# Patient Record
Sex: Male | Born: 1945 | Race: White | Hispanic: No | Smoking: Former smoker
Health system: Southern US, Community
[De-identification: ages and names within clinical notes are randomized; demographics above are authoritative.]

## PROBLEM LIST (undated history)

## (undated) DIAGNOSIS — J8410 Pulmonary fibrosis, unspecified: Secondary | ICD-10-CM

## (undated) DIAGNOSIS — I340 Nonrheumatic mitral (valve) insufficiency: Secondary | ICD-10-CM

## (undated) DIAGNOSIS — M109 Gout, unspecified: Secondary | ICD-10-CM

## (undated) DIAGNOSIS — E785 Hyperlipidemia, unspecified: Secondary | ICD-10-CM

## (undated) DIAGNOSIS — I509 Heart failure, unspecified: Secondary | ICD-10-CM

## (undated) DIAGNOSIS — I10 Essential (primary) hypertension: Secondary | ICD-10-CM

## (undated) DIAGNOSIS — J986 Disorders of diaphragm: Secondary | ICD-10-CM

## (undated) DIAGNOSIS — I2510 Atherosclerotic heart disease of native coronary artery without angina pectoris: Secondary | ICD-10-CM

## (undated) DIAGNOSIS — C469 Kaposi's sarcoma, unspecified: Secondary | ICD-10-CM

## (undated) DIAGNOSIS — J449 Chronic obstructive pulmonary disease, unspecified: Secondary | ICD-10-CM

## (undated) DIAGNOSIS — G4733 Obstructive sleep apnea (adult) (pediatric): Secondary | ICD-10-CM

## (undated) HISTORY — PX: CORONARY ANGIOPLASTY WITH STENT PLACEMENT: SHX49

## (undated) HISTORY — PX: CATARACT EXTRACTION: SUR2

---

## 2015-09-30 ENCOUNTER — Emergency Department (HOSPITAL_COMMUNITY): Payer: Medicare Other

## 2015-09-30 ENCOUNTER — Emergency Department (HOSPITAL_COMMUNITY)
Admission: EM | Admit: 2015-09-30 | Discharge: 2015-09-30 | Disposition: A | Discharge status: Home / Self Care | Payer: Medicare Other | Attending: Emergency Medicine | Admitting: Emergency Medicine

## 2015-09-30 ENCOUNTER — Encounter (HOSPITAL_COMMUNITY): Payer: Self-pay | Admitting: Emergency Medicine

## 2015-09-30 DIAGNOSIS — Z79899 Other long term (current) drug therapy: Secondary | ICD-10-CM | POA: Diagnosis not present

## 2015-09-30 DIAGNOSIS — J189 Pneumonia, unspecified organism: Secondary | ICD-10-CM

## 2015-09-30 DIAGNOSIS — Z9861 Coronary angioplasty status: Secondary | ICD-10-CM | POA: Insufficient documentation

## 2015-09-30 DIAGNOSIS — J159 Unspecified bacterial pneumonia: Secondary | ICD-10-CM | POA: Diagnosis not present

## 2015-09-30 DIAGNOSIS — Z7951 Long term (current) use of inhaled steroids: Secondary | ICD-10-CM | POA: Insufficient documentation

## 2015-09-30 DIAGNOSIS — E785 Hyperlipidemia, unspecified: Secondary | ICD-10-CM | POA: Diagnosis not present

## 2015-09-30 DIAGNOSIS — Z7982 Long term (current) use of aspirin: Secondary | ICD-10-CM | POA: Insufficient documentation

## 2015-09-30 DIAGNOSIS — R112 Nausea with vomiting, unspecified: Secondary | ICD-10-CM | POA: Diagnosis not present

## 2015-09-30 DIAGNOSIS — J8410 Pulmonary fibrosis, unspecified: Secondary | ICD-10-CM

## 2015-09-30 DIAGNOSIS — I10 Essential (primary) hypertension: Secondary | ICD-10-CM | POA: Diagnosis not present

## 2015-09-30 DIAGNOSIS — R05 Cough: Secondary | ICD-10-CM | POA: Diagnosis present

## 2015-09-30 HISTORY — DX: Hyperlipidemia, unspecified: E78.5

## 2015-09-30 HISTORY — DX: Pulmonary fibrosis, unspecified: J84.10

## 2015-09-30 HISTORY — DX: Nonrheumatic mitral (valve) insufficiency: I34.0

## 2015-09-30 HISTORY — DX: Essential (primary) hypertension: I10

## 2015-09-30 LAB — COMPREHENSIVE METABOLIC PANEL
ALT: 10 U/L — AB (ref 17–63)
AST: 14 U/L — ABNORMAL LOW (ref 15–41)
Albumin: 3.2 g/dL — ABNORMAL LOW (ref 3.5–5.0)
Alkaline Phosphatase: 88 U/L (ref 38–126)
Anion gap: 4 — ABNORMAL LOW (ref 5–15)
BUN: 12 mg/dL (ref 6–20)
CHLORIDE: 95 mmol/L — AB (ref 101–111)
CO2: 40 mmol/L — AB (ref 22–32)
CREATININE: 0.88 mg/dL (ref 0.61–1.24)
Calcium: 10 mg/dL (ref 8.9–10.3)
GFR calc non Af Amer: >60 mL/min (ref >60)
Glucose, Bld: 106 mg/dL — ABNORMAL HIGH (ref 65–99)
POTASSIUM: 4.3 mmol/L (ref 3.5–5.1)
SODIUM: 139 mmol/L (ref 135–145)
Total Bilirubin: 0.7 mg/dL (ref 0.3–1.2)
Total Protein: 6.4 g/dL — ABNORMAL LOW (ref 6.5–8.1)

## 2015-09-30 LAB — CBC WITH DIFFERENTIAL/PLATELET
BASOS ABS: 0 10*3/uL (ref 0.0–0.1)
Basophils Relative: 0 %
EOS ABS: 0 10*3/uL (ref 0.0–0.7)
EOS PCT: 1 %
HCT: 31.9 % — ABNORMAL LOW (ref 39.0–52.0)
Hemoglobin: 9.9 g/dL — ABNORMAL LOW (ref 13.0–17.0)
LYMPHS ABS: 0.3 10*3/uL — AB (ref 0.7–4.0)
Lymphocytes Relative: 5 %
MCH: 29.3 pg (ref 26.0–34.0)
MCHC: 31 g/dL (ref 30.0–36.0)
MCV: 94.4 fL (ref 78.0–100.0)
Monocytes Absolute: 0.4 10*3/uL (ref 0.1–1.0)
Monocytes Relative: 9 %
Neutro Abs: 4.3 10*3/uL (ref 1.7–7.7)
Neutrophils Relative %: 85 %
PLATELETS: 154 10*3/uL (ref 150–400)
RBC: 3.38 MIL/uL — AB (ref 4.22–5.81)
RDW: 13.7 % (ref 11.5–15.5)
WBC: 5.1 10*3/uL (ref 4.0–10.5)

## 2015-09-30 MED ORDER — IPRATROPIUM-ALBUTEROL 0.5-2.5 (3) MG/3ML IN SOLN: 3.0000 mL | Freq: Four times a day (QID) | RESPIRATORY_TRACT | Status: DC

## 2015-09-30 MED ORDER — LEVOFLOXACIN 500 MG PO TABS: 500.0000 mg | ORAL_TABLET | Freq: Every day | ORAL | Status: AC

## 2015-09-30 MED ORDER — IPRATROPIUM-ALBUTEROL 0.5-2.5 (3) MG/3ML IN SOLN
3.0000 mL | Freq: Once | RESPIRATORY_TRACT | Status: AC
  Administered 2015-09-30: 3 mL via RESPIRATORY_TRACT
  Filled 2015-09-30: qty 3

## 2015-09-30 MED ORDER — IPRATROPIUM-ALBUTEROL 0.5-2.5 (3) MG/3ML IN SOLN
3.0000 mL | RESPIRATORY_TRACT | Status: DC
Start: 2015-09-30 — End: 2015-09-30
  Filled 2015-09-30: qty 3

## 2015-09-30 MED ORDER — LEVOFLOXACIN 500 MG PO TABS
500.0000 mg | ORAL_TABLET | Freq: Once | ORAL | Status: AC
  Administered 2015-09-30: 500 mg via ORAL
  Filled 2015-09-30: qty 1

## 2015-09-30 MED ORDER — ALBUTEROL SULFATE (2.5 MG/3ML) 0.083% IN NEBU
5.0000 mg | INHALATION_SOLUTION | Freq: Once | RESPIRATORY_TRACT | Status: AC
  Administered 2015-09-30: 5 mg via RESPIRATORY_TRACT
  Filled 2015-09-30: qty 6

## 2015-09-30 MED ORDER — METOCLOPRAMIDE HCL 10 MG PO TABS: 10.0000 mg | ORAL_TABLET | Freq: Three times a day (TID) | ORAL | Status: DC | PRN

## 2015-09-30 MED ORDER — METOCLOPRAMIDE HCL 10 MG PO TABS
10.0000 mg | ORAL_TABLET | Freq: Once | ORAL | Status: AC
  Administered 2015-09-30: 10 mg via ORAL
  Filled 2015-09-30: qty 1

## 2015-09-30 NOTE — Progress Notes (Signed)
   09/30/15 0000  CM Assessment  Expected Discharge [place]  In-house Referral NA  Discharge Planning Services CM Consult  [facility] Choice Home Health  DME Arranged Oxygen  DME Agency [place] Arranged RN;[facility]  Status of Service Completed, signed off  Discharge [place]

## 2015-09-30 NOTE — Care Management Note (Addendum)
Case Management Note  Patient Details  Name: Joshua Conrad MRN: [id number] Date of Birth: [birth year]  Subjective/Objective:               Pt states he lives with Joshua Conrad (works at [employer] in dialysis) and her husband  In [place] Pt on 4 L O2 at home provided by [employer] He moved to [place] 11/2 -2 months ago Has seen Dr Nancy Fetter at [place] 1 1/2 -2 weeks ago He thought he was being assisted with home health via Dr Nancy Fetter off [address] at [place] but no response Pt choice is [facility] or [facility] sevices already set up by Joshua Conrad.     Action/Plan:  Assessed for needs CM reviewed in details medicare guidelines, home health [facility]) (length of stay in home, types of [facility] staff available, coverage, primary caregiver, up to 24 hrs before services may be started) and Private duty nursing (PDN-coverage, length of stay in the home types of staff available). CM reviewed availability of [place] [facility] to assist pcp to get pt to snf (if desired disposition) from the community level. CM provided pt/family with a list of [place] home health agencies and PDN. Discussed Long term care coverage for PDN services  Discussed senior  services of [place]  CM spokew ith Tiffany at [phone] [facility] to call in [place]  1537 spoke with Dr Dayna Barker about faceto face  [phone] a referral for meals on wheels provided to [name]  Expected Discharge Date:   09/30/15                Expected Discharge Plan:  [place]  In-House Referral:  NA  Discharge planning Services  CM Consult  Post Acute Care Choice:  Home Health Choice offered to:     DME Arranged:  Oxygen DME Agency:  [employer]  HH Arranged:  RN, Nurse[facility] [place] Agency:  [place]  Status of Service:  Completed, signed off    Additional Comments:  Robbie Lis, RN 09/30/2015, 3:28 PM

## 2015-09-30 NOTE — ED Notes (Signed)
X-ray at bedside

## 2015-09-30 NOTE — ED Notes (Signed)
Per pt, states nausea, dry heaves and pulmonary congestion for about a week

## 2015-09-30 NOTE — ED Notes (Signed)
Unsuccessful attempt for blood work

## 2015-09-30 NOTE — ED Provider Notes (Signed)
CSN: [id number]     Arrival date & time 09/30/15  1152 History   First MD Initiated Contact with Patient 09/30/15 1301     Chief Complaint  Patient presents with  . pulmonary congestion   . Nausea     (Consider location/radiation/quality/duration/timing/severity/associated sxs/prior Treatment) Patient is a 69 y.o. male presenting with cough.  Cough Cough characteristics:  Productive Sputum characteristics:  Yellow and green Severity:  Moderate Onset quality:  Gradual Duration:  5 days Progression:  Worsening Chronicity:  Recurrent Relieved by:  None tried Worsened by:  Nothing tried Ineffective treatments:  None tried Associated symptoms: chest pain, chills, fever and shortness of breath   Associated symptoms: no rhinorrhea     Past Medical History  Diagnosis Date  . Pulmonary fibrosis ([place])   . MI (mitral incompetence)   . Hypertension   . Hyperlipidemia    Past Surgical History  Procedure Laterality Date  . Coronary angioplasty with stent placement    . Cataract extraction     No family history on file. Social History  Substance Use Topics  . Smoking status: Never Smoker   . Smokeless tobacco: None  . Alcohol Use: No    Review of Systems  Constitutional: Positive for fever, chills and unexpected weight change.  HENT: Negative for rhinorrhea.   Eyes: Negative for visual disturbance.  Respiratory: Positive for cough and shortness of breath.   Cardiovascular: Positive for chest pain.  Gastrointestinal: Positive for nausea and vomiting. Negative for abdominal pain.  Endocrine: Negative for polydipsia and polyuria.  Genitourinary: Negative for frequency and flank pain.  Musculoskeletal: Negative for back pain and neck pain.  Skin: Negative for pallor.      Allergies  Prednisone  Home Medications   Prior to Admission medications   Medication Sig Start Date End Date Taking? Authorizing Provider  albuterol (PROVENTIL HFA;VENTOLIN HFA) 108 (90 BASE)  MCG/ACT inhaler Inhale 2 puffs into the lungs every 6 (six) hours as needed for wheezing or shortness of breath.   Yes Historical Provider, MD  albuterol (PROVENTIL) (2.5 MG/3ML) 0.083% nebulizer solution Take 2.5 mg by nebulization every 4 (four) hours as needed for wheezing or shortness of breath.   Yes Historical Provider, MD  allopurinol (ZYLOPRIM) 300 MG tablet Take 300 mg by mouth daily.   Yes Historical Provider, MD  Alum Hydroxide-Mag Carbonate (GAVISCON PO) Take 15-30 mLs by mouth 2 (two) times daily as needed (upset stomach).   Yes Historical Provider, MD  aspirin 81 MG tablet Take 81 mg by mouth daily.   Yes Historical Provider, MD  bumetanide (BUMEX) 1 MG tablet Take 1 mg by mouth daily.   Yes Historical Provider, MD  cholecalciferol (VITAMIN D) 400 UNITS TABS tablet Take 400 Units by mouth daily.   Yes Historical Provider, MD  ferrous sulfate 325 (65 FE) MG tablet Take 325 mg by mouth 2 (two) times daily.   Yes Historical Provider, MD  fluticasone (FLONASE) 50 MCG/ACT nasal spray Place 2 sprays into both nostrils at bedtime.   Yes Historical Provider, MD  losartan (COZAAR) 50 MG tablet Take 50 mg by mouth daily.   Yes Historical Provider, MD  montelukast (SINGULAIR) 10 MG tablet Take 10 mg by mouth at bedtime.   Yes Historical Provider, MD  mycophenolate (CELLCEPT) 500 MG tablet Take 1,500 mg by mouth 2 (two) times daily.   Yes Historical Provider, MD  OXYGEN Inhale 4 L into the lungs continuous.   Yes Historical Provider, MD  potassium chloride (K-DUR)  10 MEQ tablet Take 10 mEq by mouth daily.   Yes Historical Provider, MD  ranitidine (ZANTAC) 150 MG tablet Take 150 mg by mouth 2 (two) times daily.   Yes Historical Provider, MD  simvastatin (ZOCOR) 5 MG tablet Take 5 mg by mouth at bedtime.   Yes Historical Provider, MD  vitamin C (ASCORBIC ACID) 500 MG tablet Take 500 mg by mouth daily.   Yes Historical Provider, MD  levofloxacin (LEVAQUIN) 500 MG tablet Take 1 tablet (500 mg total)  by mouth daily. 10/01/15 10/10/15  Merrily Pew, MD  metoCLOPramide (REGLAN) 10 MG tablet Take 1 tablet (10 mg total) by mouth every 8 (eight) hours as needed for nausea. 09/30/15   Corene Cornea Brinda Focht, MD   BP 139/84 mmHg  Pulse 100  Temp(Src) 98.1 F (36.7 C) (Oral)  Resp 20  SpO2 98% Physical Exam  Constitutional: He is oriented to person, place, and time. He appears well-developed and well-nourished.  HENT:  Head: Normocephalic and atraumatic.  Eyes: Conjunctivae and EOM are normal.  Neck: Normal range of motion. Neck supple.  Cardiovascular: Normal rate and regular rhythm.   Pulmonary/Chest: Effort normal. Tachypnea noted. No respiratory distress. He has no decreased breath sounds. He has wheezes. He has rhonchi. He has rales.  Abdominal: Soft. There is no tenderness.  Musculoskeletal: Normal range of motion. He exhibits no edema or tenderness.  Neurological: He is alert and oriented to person, place, and time.  Skin: Skin is warm and dry.  Nursing note and vitals reviewed.   ED Course  Procedures (including critical care time) Labs Review Labs Reviewed  CBC WITH DIFFERENTIAL/PLATELET - Abnormal; Notable for the following:    RBC 3.38 (*)    Hemoglobin 9.9 (*)    HCT 31.9 (*)    Lymphs Abs 0.3 (*)    All other components within normal limits  COMPREHENSIVE METABOLIC PANEL - Abnormal; Notable for the following:    Chloride 95 (*)    CO2 40 (*)    Glucose, Bld 106 (*)    Total Protein 6.4 (*)    Albumin 3.2 (*)    AST 14 (*)    ALT 10 (*)    Anion gap 4 (*)    All other components within normal limits    Imaging Review Dg Chest Portable 1 View  09/30/2015  CLINICAL DATA:  Shortness of breath. EXAM: PORTABLE CHEST 1 VIEW COMPARISON:  None. FINDINGS: Hypoinflation of the lungs is noted. Elevated left hemidiaphragm is noted. No pneumothorax is noted. Diffuse interstitial opacities are noted throughout both lungs concerning for edema or possibly pneumonia. Bony thorax is  unremarkable. IMPRESSION: Hypoinflation of the lungs is noted. Elevated left hemidiaphragm is noted. Bibasilar interstitial densities are noted concerning for edema or interstitial pneumonia. No pneumothorax or significant pleural effusion is noted. Electronically Signed   By: Marijo Conception, M.D.   On: 09/30/2015 13:50   I have personally reviewed and evaluated these images and lab results as part of my medical decision-making.   EKG Interpretation None      MDM   Final diagnoses:  Community acquired pneumonia  Pulmonary fibrosis ([place])    New here from [place] area, has pulmonary fibrosis, 4-5 days of worsening sputum production, cough, fatigue, post tussive emesis, weight loss, fever and chills. No new O2 requirement or distress. Can't take steroids. Duo nebs given in ED with some improvement. Care management to help as patient had needs in home with sister in law. abx given and  rx for same. Pt will continue following up with pulmonologist and pcp. No indication for admission at this time.   I have personally and contemperaneously reviewed labs and imaging and used in my decision making as above.   A medical screening exam was performed and I feel the patient has had an appropriate workup for their chief complaint at this time and likelihood of emergent condition existing is low. They have been counseled on decision, discharge, follow up and which symptoms necessitate immediate return to the emergency department. They or their family verbally stated understanding and agreement with plan and discharged in stable condition.      Merrily Pew, MD 10/01/15 [phone]

## 2015-11-01 ENCOUNTER — Emergency Department (HOSPITAL_COMMUNITY): Payer: Medicare Other

## 2015-11-01 ENCOUNTER — Observation Stay (HOSPITAL_COMMUNITY)
Admission: EM | Admit: 2015-11-01 | Discharge: 2015-11-03 | Disposition: A | Discharge status: Home / Self Care | Payer: Medicare Other | Attending: Family Medicine | Admitting: Family Medicine

## 2015-11-01 ENCOUNTER — Encounter (HOSPITAL_COMMUNITY): Payer: Self-pay | Admitting: *Deleted

## 2015-11-01 DIAGNOSIS — J986 Disorders of diaphragm: Secondary | ICD-10-CM | POA: Insufficient documentation

## 2015-11-01 DIAGNOSIS — C469 Kaposi's sarcoma, unspecified: Secondary | ICD-10-CM | POA: Diagnosis not present

## 2015-11-01 DIAGNOSIS — R410 Disorientation, unspecified: Secondary | ICD-10-CM | POA: Diagnosis not present

## 2015-11-01 DIAGNOSIS — D649 Anemia, unspecified: Secondary | ICD-10-CM | POA: Diagnosis present

## 2015-11-01 DIAGNOSIS — I252 Old myocardial infarction: Secondary | ICD-10-CM | POA: Insufficient documentation

## 2015-11-01 DIAGNOSIS — Z66 Do not resuscitate: Secondary | ICD-10-CM | POA: Diagnosis not present

## 2015-11-01 DIAGNOSIS — Z888 Allergy status to other drugs, medicaments and biological substances status: Secondary | ICD-10-CM | POA: Insufficient documentation

## 2015-11-01 DIAGNOSIS — J320 Chronic maxillary sinusitis: Secondary | ICD-10-CM | POA: Insufficient documentation

## 2015-11-01 DIAGNOSIS — J449 Chronic obstructive pulmonary disease, unspecified: Secondary | ICD-10-CM | POA: Diagnosis not present

## 2015-11-01 DIAGNOSIS — J8410 Pulmonary fibrosis, unspecified: Secondary | ICD-10-CM | POA: Insufficient documentation

## 2015-11-01 DIAGNOSIS — R197 Diarrhea, unspecified: Secondary | ICD-10-CM | POA: Diagnosis not present

## 2015-11-01 DIAGNOSIS — I2510 Atherosclerotic heart disease of native coronary artery without angina pectoris: Secondary | ICD-10-CM | POA: Diagnosis present

## 2015-11-01 DIAGNOSIS — E785 Hyperlipidemia, unspecified: Secondary | ICD-10-CM | POA: Insufficient documentation

## 2015-11-01 DIAGNOSIS — G4733 Obstructive sleep apnea (adult) (pediatric): Secondary | ICD-10-CM | POA: Diagnosis not present

## 2015-11-01 DIAGNOSIS — I110 Hypertensive heart disease with heart failure: Secondary | ICD-10-CM | POA: Diagnosis not present

## 2015-11-01 DIAGNOSIS — Z79899 Other long term (current) drug therapy: Secondary | ICD-10-CM | POA: Insufficient documentation

## 2015-11-01 DIAGNOSIS — I509 Heart failure, unspecified: Secondary | ICD-10-CM | POA: Insufficient documentation

## 2015-11-01 DIAGNOSIS — Z923 Personal history of irradiation: Secondary | ICD-10-CM | POA: Diagnosis not present

## 2015-11-01 DIAGNOSIS — I5032 Chronic diastolic (congestive) heart failure: Secondary | ICD-10-CM

## 2015-11-01 DIAGNOSIS — D61818 Other pancytopenia: Secondary | ICD-10-CM | POA: Insufficient documentation

## 2015-11-01 DIAGNOSIS — Z87891 Personal history of nicotine dependence: Secondary | ICD-10-CM | POA: Diagnosis not present

## 2015-11-01 DIAGNOSIS — Z7982 Long term (current) use of aspirin: Secondary | ICD-10-CM | POA: Insufficient documentation

## 2015-11-01 HISTORY — DX: Heart failure, unspecified: I50.9

## 2015-11-01 HISTORY — DX: Atherosclerotic heart disease of native coronary artery without angina pectoris: I25.10

## 2015-11-01 HISTORY — DX: Chronic obstructive pulmonary disease, unspecified: J44.9

## 2015-11-01 LAB — URINALYSIS, ROUTINE W REFLEX MICROSCOPIC
BILIRUBIN URINE: NEGATIVE
GLUCOSE, UA: NEGATIVE mg/dL
Hgb urine dipstick: NEGATIVE
Ketones, ur: NEGATIVE mg/dL
LEUKOCYTES UA: NEGATIVE
NITRITE: NEGATIVE
PH: 6.5 (ref 5.0–8.0)
Protein, ur: NEGATIVE mg/dL
SPECIFIC GRAVITY, URINE: 1.014 (ref 1.005–1.030)

## 2015-11-01 LAB — RAPID URINE DRUG SCREEN, HOSP PERFORMED
Amphetamines: NOT DETECTED
BENZODIAZEPINES: NOT DETECTED
Barbiturates: NOT DETECTED
COCAINE: NOT DETECTED
Opiates: NOT DETECTED
Tetrahydrocannabinol: NOT DETECTED

## 2015-11-01 LAB — CBC WITH DIFFERENTIAL/PLATELET
BASOS PCT: 0 %
Basophils Absolute: 0 10*3/uL (ref 0.0–0.1)
EOS ABS: 0.2 10*3/uL (ref 0.0–0.7)
EOS PCT: 6 %
HCT: 33.9 % — ABNORMAL LOW (ref 39.0–52.0)
HEMOGLOBIN: 10 g/dL — AB (ref 13.0–17.0)
LYMPHS ABS: 0.4 10*3/uL — AB (ref 0.7–4.0)
Lymphocytes Relative: 14 %
MCH: 29.2 pg (ref 26.0–34.0)
MCHC: 29.5 g/dL — AB (ref 30.0–36.0)
MCV: 98.8 fL (ref 78.0–100.0)
Monocytes Absolute: 0.4 10*3/uL (ref 0.1–1.0)
Monocytes Relative: 12 %
NEUTROS PCT: 68 %
Neutro Abs: 2 10*3/uL (ref 1.7–7.7)
PLATELETS: 121 10*3/uL — AB (ref 150–400)
RBC: 3.43 MIL/uL — AB (ref 4.22–5.81)
RDW: 13.6 % (ref 11.5–15.5)
WBC: 3 10*3/uL — AB (ref 4.0–10.5)

## 2015-11-01 LAB — COMPREHENSIVE METABOLIC PANEL
ALBUMIN: 3.1 g/dL — AB (ref 3.5–5.0)
ALT: 13 U/L — AB (ref 17–63)
ANION GAP: 5 (ref 5–15)
AST: 19 U/L (ref 15–41)
Alkaline Phosphatase: 82 U/L (ref 38–126)
BUN: 10 mg/dL (ref 6–20)
CHLORIDE: 92 mmol/L — AB (ref 101–111)
CO2: 44 mmol/L — AB (ref 22–32)
Calcium: 9.5 mg/dL (ref 8.9–10.3)
Creatinine, Ser: 0.94 mg/dL (ref 0.61–1.24)
GFR calc non Af Amer: >60 mL/min (ref >60)
GLUCOSE: 90 mg/dL (ref 65–99)
Potassium: 3.9 mmol/L (ref 3.5–5.1)
SODIUM: 141 mmol/L (ref 135–145)
Total Bilirubin: 0.6 mg/dL (ref 0.3–1.2)
Total Protein: 5.6 g/dL — ABNORMAL LOW (ref 6.5–8.1)

## 2015-11-01 NOTE — ED Provider Notes (Addendum)
Level5 caveat altered mental status history is obtained from patient's sister-in-law who accompanies him. Patient has been seeing people that aren't there. And hearing voices since today. He had a blanket lying on top of him which she felt was a dog. Hallucinations are a new phenomenon for him since today. Patient admits to seeing things that aren't there. He is presently alert Glasgow Coma Score 15 answering appropriately    ED ECG REPORT   Date: 11/01/2015  Rate: 80  Rhythm: normal sinus rhythm  QRS Axis: normal  Intervals: normal  ST/T Wave abnormalities: nonspecific T wave changes  Conduction Disutrbances:none  Narrative Interpretation:   Old EKG Reviewed: none available  I have personally reviewed the EKG tracing and agree with the computerized printout as noted. Orlie Dakin, MD 11/01/15 [place], MD 11/02/15 [phone]

## 2015-11-01 NOTE — ED Provider Notes (Signed)
CSN: [id number]     Arrival date & time 11/01/15  1628 History   First MD Initiated Contact with Patient 11/01/15 1712     Chief Complaint  Patient presents with  . Hallucinations     (Consider location/radiation/quality/duration/timing/severity/associated sxs/prior Treatment) HPI  Patient is a 69 year old male with past medical history significant for pulmonary fibrosis, CAD, HTN, HLD, Kaposi's sarcoma undergoing radiation, who presents to the emergency department with visual hallucinations. Patient is accompanied by his sister-in-law who is currently living with the patient.  Sister-in-law is providing the history. Reports a 1 day history of visual hallucinations. He reports that he has been talking to people who are not there, reported that there was a cute dog on his lap that was not present. Also reports auditory hallucinations. Yesterday he slept throughout the day, did not sleep well overnight. Denies fevers, chills, chest pain, worsening shortness of breath, cough, congestion, abdominal pain, dysuria, diarrhea. No prior history of auditory hallucinations. Denies recent falls or head trauma. Patient currently denies any acute problems, uncertain why he is in the emergency department.  Past Medical History  Diagnosis Date  . Pulmonary fibrosis ([place])   . MI (mitral incompetence)   . Hypertension   . Hyperlipidemia   . Coronary artery disease   . CHF (congestive heart failure) ([place])   . COPD (chronic obstructive pulmonary disease) [facility])    Past Surgical History  Procedure Laterality Date  . Coronary angioplasty with stent placement    . Cataract extraction     Family History  Problem Relation Age of Onset  . Hypertension Mother   . Hypertension Father    Social History  Substance Use Topics  . Smoking status: Former Research scientist (life sciences)  . Smokeless tobacco: None  . Alcohol Use: No     Comment: Quit 8 years ago.    Review of Systems  Constitutional: Negative for fever and appetite  change.  HENT: Negative for congestion and trouble swallowing.   Eyes: Negative for visual disturbance.  Respiratory: Negative for chest tightness and shortness of breath.   Cardiovascular: Negative for chest pain and palpitations.  Gastrointestinal: Negative for nausea, vomiting, abdominal pain and blood in stool.  Genitourinary: Negative for dysuria, hematuria, flank pain and decreased urine volume.  Musculoskeletal: Negative for back pain and neck pain.  Skin: Negative for rash.  Neurological: Negative for dizziness, seizures, syncope, facial asymmetry, weakness, light-headedness, numbness and headaches.  Psychiatric/Behavioral: Positive for hallucinations (auditory and visual) and confusion.      Allergies  Prednisone  Home Medications   Prior to Admission medications   Medication Sig Start Date End Date Taking? Authorizing Provider  albuterol (PROVENTIL HFA;VENTOLIN HFA) 108 (90 BASE) MCG/ACT inhaler Inhale 2 puffs into the lungs every 6 (six) hours as needed for wheezing or shortness of breath.   Yes Historical Provider, MD  albuterol (PROVENTIL) (2.5 MG/3ML) 0.083% nebulizer solution Take 2.5 mg by nebulization every 4 (four) hours as needed for wheezing or shortness of breath.   Yes Historical Provider, MD  allopurinol (ZYLOPRIM) 300 MG tablet Take 300 mg by mouth daily.   Yes Historical Provider, MD  aspirin 81 MG tablet Take 81 mg by mouth daily.   Yes Historical Provider, MD  bumetanide (BUMEX) 1 MG tablet Take 1 mg by mouth daily.   Yes Historical Provider, MD  cholecalciferol (VITAMIN D) 400 UNITS TABS tablet Take 400 Units by mouth daily.   Yes Historical Provider, MD  ferrous sulfate 325 (65 FE) MG tablet Take 325  mg by mouth 2 (two) times daily.   Yes Historical Provider, MD  fluticasone (FLONASE) 50 MCG/ACT nasal spray Place 2 sprays into both nostrils at bedtime.   Yes Historical Provider, MD  losartan (COZAAR) 50 MG tablet Take 50 mg by mouth daily.   Yes Historical  Provider, MD  metoCLOPramide (REGLAN) 10 MG tablet Take 1 tablet (10 mg total) by mouth every 8 (eight) hours as needed for nausea. 09/30/15  Yes Merrily Pew, MD  montelukast (SINGULAIR) 10 MG tablet Take 10 mg by mouth at bedtime.   Yes Historical Provider, MD  mycophenolate (CELLCEPT) 500 MG tablet Take 1,500 mg by mouth 2 (two) times daily.   Yes Historical Provider, MD  OXYGEN Inhale 4 L into the lungs continuous.   Yes Historical Provider, MD  potassium chloride (K-DUR) 10 MEQ tablet Take 10 mEq by mouth daily.   Yes Historical Provider, MD  ranitidine (ZANTAC) 150 MG tablet Take 150 mg by mouth 2 (two) times daily.   Yes Historical Provider, MD  simvastatin (ZOCOR) 5 MG tablet Take 5 mg by mouth at bedtime.   Yes Historical Provider, MD  vitamin C (ASCORBIC ACID) 500 MG tablet Take 500 mg by mouth daily.   Yes Historical Provider, MD   BP 134/72 mmHg  Pulse 77  Temp(Src) 98.2 F (36.8 C) (Oral)  Resp 18  SpO2 100% Physical Exam  Constitutional: He is oriented to person, place, and time. He appears well-developed and well-nourished. No distress.  HENT:  Head: Normocephalic and atraumatic.  Right Ear: External ear normal.  Left Ear: External ear normal.  Mouth/Throat: Oropharynx is clear and moist.  Eyes: Conjunctivae and EOM are normal. Pupils are equal, round, and reactive to light. No scleral icterus.  Neck: Normal range of motion. Neck supple. No JVD present. No tracheal deviation present.  Cardiovascular: Normal rate, regular rhythm and intact distal pulses.   Pulmonary/Chest: Effort normal. No respiratory distress. He has no wheezes.  5L [place] (baseline home O2), diffuse coarse breath sounds throughout  Abdominal: Soft. He exhibits no distension. There is no tenderness. There is no rebound and no guarding.  Musculoskeletal: Normal range of motion.  Neurological: He is alert and oriented to person, place, and time. He has normal strength and normal reflexes. He displays no  tremor. No cranial nerve deficit or sensory deficit. He displays no seizure activity. Coordination normal. GCS eye subscore is 4. GCS verbal subscore is 4. GCS motor subscore is 6. He displays no Babinski's sign on the right side. He displays no Babinski's sign on the left side.  Skin: Skin is warm.  Psychiatric: He has a normal mood and affect.  Nursing note and vitals reviewed.   ED Course  Procedures (including critical care time) Labs Review Labs Reviewed  CBC WITH DIFFERENTIAL/PLATELET - Abnormal; Notable for the following:    WBC 3.0 (*)    RBC 3.43 (*)    Hemoglobin 10.0 (*)    HCT 33.9 (*)    MCHC 29.5 (*)    Platelets 121 (*)    Lymphs Abs 0.4 (*)    All other components within normal limits  COMPREHENSIVE METABOLIC PANEL - Abnormal; Notable for the following:    Chloride 92 (*)    CO2 44 (*)    Total Protein 5.6 (*)    Albumin 3.1 (*)    ALT 13 (*)    All other components within normal limits  URINALYSIS, ROUTINE W REFLEX MICROSCOPIC (NOT AT [facility])  URINE RAPID  DRUG SCREEN, HOSP PERFORMED    Imaging Review Dg Chest 2 View  11/01/2015  CLINICAL DATA:  69 year old male with confusion and shortness of breath EXAM: CHEST  2 VIEW COMPARISON:  Prior chest x-ray 09/30/2015 FINDINGS: Very low inspiratory volumes with extensive predominantly basilar and peripheral interstitial opacities in a reticular pattern. Overall, there is very little change in the appearance of the lungs compared to prior imaging. Cardiac and the mediastinal contours are grossly unchanged and partially distorted due to chronic elevation of the left hemidiaphragm. No pneumothorax. The visualized bowel gas pattern is unremarkable. No acute osseous abnormality. IMPRESSION: Stable appearance of advanced pulmonary parenchymal changes most suggestive of advanced pulmonary fibrosis. Persistent chronic elevation of the left hemidiaphragm. Electronically Signed   By: Jacqulynn Cadet M.D.   On: 11/01/2015 18:52   Ct  Head Wo Contrast  11/01/2015  CLINICAL DATA:  Initial evaluation for intermittent headaches, visual hallucinations. EXAM: CT HEAD WITHOUT CONTRAST TECHNIQUE: Contiguous axial images were obtained from the base of the skull through the vertex without intravenous contrast. COMPARISON:  None. FINDINGS: Diffuse prominence of the CSF containing spaces is consistent with generalized age-related cerebral atrophy. Patchy hypodensity within the periventricular deep white matter both cerebral hemispheres most consistent with chronic small vessel ischemic disease. Scattered vascular calcifications within the carotid siphons. No acute large vessel territory infarct. No acute intracranial hemorrhage. No mass lesion, midline shift, or mass effect. No hydrocephalus. No extra-axial fluid collection. Scalp soft tissues within normal limits. No acute abnormality about the orbits. Moderate mucosal thickening within the right maxillary sinus, chronic in nature. Opacity present at the right sphenoid ethmoidal recess. Scattered mucosal thickening within the ethmoidal air cells. Mild opacity within the inferior left mastoid air cells. Calvarium intact. IMPRESSION: 1. No acute intracranial process. 2. Generalized age-related cerebral atrophy with moderate chronic small vessel ischemic disease. 3. Chronic right maxillary sinus disease. Electronically Signed   By: Jeannine Boga M.D.   On: 11/01/2015 20:45   I have personally reviewed and evaluated these images and lab results as part of my medical decision-making.   EKG Interpretation None      MDM   Final diagnoses:  Delirium    Patient is a 69 year old male with past medical history of pulmonary fibrosis (home O2 5L), CAD, HTN, Kaposi's sarcoma, who presents to the emergency department with acute delirium. On arrival no acute distress, not ill appearing. GCS 14, answering most questions appropriately. AAO to name, place, not to time. Afebrile, hemodynamically  stable. Exam as above, normal neurologic exam, coarse breath sounds throughout, benign abdominal exam, no signs of trauma.  SPO2 100% on 5L [place].  Differential diagnosis includes metabolic encephalopathy, hepatic encephalopathy, hypoxia, acute intoxication, infectious, CNS insult (malignancy, infarction, hemorrhage), seizure.    WBC 3.0, Hgb 10.0 (at baseline). No significant electrolyte abnormalities. CXR showed stable appearance of advanced pulmonary fibrosis, no signs of acute infection. UA showed no signs of UTI. CT head showed no acute findings.  Given the patient's immunocompromised state secondary to chemotherapy and acute mental status change, we will admit to medicine for further workup of altered mental status with acute delirium. Patient stable for the floor. Transferred to the floor in stable condition.    Nathaniel Man, MD 11/02/15 [id number]  Orlie Dakin, MD 11/03/15 [phone]

## 2015-11-01 NOTE — ED Notes (Signed)
Pt presents via [facility] from home with sister for confusion and hallucinations.  Per pt he was falling asleep and saw people and heard them talking, thinks he was dreaming.  Voices did not concern him or tell him to do anything.  Pt a x 4, NAD, doesn't think he needs to be here.  Answers questions appropriately when asked.  Hx: stents, ppulmonary fibrosis, carpozi sarcoma.  BP-159/87 P-86 O2-91% 4L, wears 5 L at home, CBG-112.

## 2015-11-02 ENCOUNTER — Ambulatory Visit (HOSPITAL_COMMUNITY): Payer: Medicare Other

## 2015-11-02 ENCOUNTER — Encounter (HOSPITAL_COMMUNITY): Payer: Self-pay | Admitting: Internal Medicine

## 2015-11-02 ENCOUNTER — Observation Stay (HOSPITAL_COMMUNITY): Payer: Medicare Other

## 2015-11-02 DIAGNOSIS — J449 Chronic obstructive pulmonary disease, unspecified: Secondary | ICD-10-CM | POA: Diagnosis present

## 2015-11-02 DIAGNOSIS — R410 Disorientation, unspecified: Secondary | ICD-10-CM | POA: Diagnosis not present

## 2015-11-02 DIAGNOSIS — I2510 Atherosclerotic heart disease of native coronary artery without angina pectoris: Secondary | ICD-10-CM | POA: Diagnosis present

## 2015-11-02 DIAGNOSIS — G4733 Obstructive sleep apnea (adult) (pediatric): Secondary | ICD-10-CM | POA: Diagnosis not present

## 2015-11-02 DIAGNOSIS — I5032 Chronic diastolic (congestive) heart failure: Secondary | ICD-10-CM

## 2015-11-02 DIAGNOSIS — D649 Anemia, unspecified: Secondary | ICD-10-CM | POA: Diagnosis present

## 2015-11-02 LAB — BASIC METABOLIC PANEL
ANION GAP: 5 (ref 5–15)
Anion gap: 3 — ABNORMAL LOW (ref 5–15)
BUN: 10 mg/dL (ref 6–20)
BUN: 10 mg/dL (ref 6–20)
CALCIUM: 9.2 mg/dL (ref 8.9–10.3)
CHLORIDE: 95 mmol/L — AB (ref 101–111)
CO2: 44 mmol/L — ABNORMAL HIGH (ref 22–32)
CO2: 43 mmol/L — AB (ref 22–32)
CREATININE: 1 mg/dL (ref 0.61–1.24)
Calcium: 9 mg/dL (ref 8.9–10.3)
Chloride: 93 mmol/L — ABNORMAL LOW (ref 101–111)
Creatinine, Ser: 1.03 mg/dL (ref 0.61–1.24)
GLUCOSE: 128 mg/dL — AB (ref 65–99)
Glucose, Bld: 121 mg/dL — ABNORMAL HIGH (ref 65–99)
POTASSIUM: 3.6 mmol/L (ref 3.5–5.1)
Potassium: 3.7 mmol/L (ref 3.5–5.1)
SODIUM: 142 mmol/L (ref 135–145)
Sodium: 141 mmol/L (ref 135–145)

## 2015-11-02 LAB — CBC
HCT: 28.5 % — ABNORMAL LOW (ref 39.0–52.0)
HEMOGLOBIN: 8.6 g/dL — AB (ref 13.0–17.0)
MCH: 29.5 pg (ref 26.0–34.0)
MCHC: 30.2 g/dL (ref 30.0–36.0)
MCV: 97.6 fL (ref 78.0–100.0)
PLATELETS: 86 10*3/uL — AB (ref 150–400)
RBC: 2.92 MIL/uL — AB (ref 4.22–5.81)
RDW: 13.6 % (ref 11.5–15.5)
WBC: 2.3 10*3/uL — ABNORMAL LOW (ref 4.0–10.5)

## 2015-11-02 LAB — AMMONIA: AMMONIA: 35 umol/L (ref 9–35)

## 2015-11-02 MED ORDER — BUMETANIDE 1 MG PO TABS
1.0000 mg | ORAL_TABLET | Freq: Every day | ORAL | Status: DC
  Administered 2015-11-02 – 2015-11-03 (×2): 1 mg via ORAL
  Filled 2015-11-02 (×2): qty 1

## 2015-11-02 MED ORDER — MYCOPHENOLATE MOFETIL 500 MG PO TABS: 1500.0000 mg | ORAL_TABLET | Freq: Two times a day (BID) | ORAL | Status: DC

## 2015-11-02 MED ORDER — CHOLECALCIFEROL 10 MCG (400 UNIT) PO TABS
400.0000 [IU] | ORAL_TABLET | Freq: Every day | ORAL | Status: DC
  Administered 2015-11-02 – 2015-11-03 (×2): 400 [IU] via ORAL
  Filled 2015-11-02 (×2): qty 1

## 2015-11-02 MED ORDER — ENOXAPARIN SODIUM 40 MG/0.4ML ~~LOC~~ SOLN
40.0000 mg | SUBCUTANEOUS | Status: DC
Start: 2015-11-02 — End: 2015-11-03
  Administered 2015-11-02 – 2015-11-03 (×2): 40 mg via SUBCUTANEOUS
  Filled 2015-11-02 (×2): qty 0.4

## 2015-11-02 MED ORDER — LOSARTAN POTASSIUM 50 MG PO TABS
50.0000 mg | ORAL_TABLET | Freq: Every day | ORAL | Status: DC
  Administered 2015-11-02 – 2015-11-03 (×2): 50 mg via ORAL
  Filled 2015-11-02 (×2): qty 1

## 2015-11-02 MED ORDER — FAMOTIDINE 20 MG PO TABS
20.0000 mg | ORAL_TABLET | Freq: Two times a day (BID) | ORAL | Status: DC
  Administered 2015-11-02 – 2015-11-03 (×3): 20 mg via ORAL
  Filled 2015-11-02 (×3): qty 1

## 2015-11-02 MED ORDER — ASPIRIN 81 MG PO CHEW
81.0000 mg | CHEWABLE_TABLET | Freq: Every day | ORAL | Status: DC
  Administered 2015-11-02 – 2015-11-03 (×2): 81 mg via ORAL
  Filled 2015-11-02 (×2): qty 1

## 2015-11-02 MED ORDER — MONTELUKAST SODIUM 10 MG PO TABS
10.0000 mg | ORAL_TABLET | Freq: Every day | ORAL | Status: DC
  Administered 2015-11-03: 10 mg via ORAL
  Filled 2015-11-02 (×2): qty 1

## 2015-11-02 MED ORDER — ALBUTEROL SULFATE (2.5 MG/3ML) 0.083% IN NEBU: 2.5000 mg | INHALATION_SOLUTION | RESPIRATORY_TRACT | Status: DC | PRN

## 2015-11-02 MED ORDER — MYCOPHENOLATE MOFETIL 250 MG PO CAPS
1500.0000 mg | ORAL_CAPSULE | Freq: Two times a day (BID) | ORAL | Status: DC
  Administered 2015-11-02 – 2015-11-03 (×3): 1500 mg via ORAL
  Filled 2015-11-02 (×4): qty 6

## 2015-11-02 MED ORDER — FLUTICASONE PROPIONATE 50 MCG/ACT NA SUSP
2.0000 | Freq: Every day | NASAL | Status: DC
  Administered 2015-11-03: 2 via NASAL
  Filled 2015-11-02: qty 16

## 2015-11-02 MED ORDER — ALBUTEROL SULFATE HFA 108 (90 BASE) MCG/ACT IN AERS: 2.0000 | INHALATION_SPRAY | Freq: Four times a day (QID) | RESPIRATORY_TRACT | Status: DC | PRN

## 2015-11-02 MED ORDER — FERROUS SULFATE 325 (65 FE) MG PO TABS
325.0000 mg | ORAL_TABLET | Freq: Two times a day (BID) | ORAL | Status: DC
  Administered 2015-11-02 (×2): 325 mg via ORAL
  Filled 2015-11-02 (×2): qty 1

## 2015-11-02 MED ORDER — ALLOPURINOL 300 MG PO TABS
300.0000 mg | ORAL_TABLET | Freq: Every day | ORAL | Status: DC
Start: 2015-11-02 — End: 2015-11-03
  Administered 2015-11-02 – 2015-11-03 (×2): 300 mg via ORAL
  Filled 2015-11-02 (×2): qty 1

## 2015-11-02 MED ORDER — ONDANSETRON HCL 4 MG/2ML IJ SOLN: 4.0000 mg | Freq: Four times a day (QID) | INTRAMUSCULAR | Status: DC | PRN

## 2015-11-02 MED ORDER — VITAMIN C 500 MG PO TABS
500.0000 mg | ORAL_TABLET | Freq: Every day | ORAL | Status: DC
  Administered 2015-11-02 – 2015-11-03 (×2): 500 mg via ORAL
  Filled 2015-11-02 (×2): qty 1

## 2015-11-02 MED ORDER — SIMVASTATIN 10 MG PO TABS
5.0000 mg | ORAL_TABLET | Freq: Every day | ORAL | Status: DC
  Administered 2015-11-03: 5 mg via ORAL
  Filled 2015-11-02: qty 1

## 2015-11-02 MED ORDER — POTASSIUM CHLORIDE ER 10 MEQ PO TBCR
10.0000 meq | EXTENDED_RELEASE_TABLET | Freq: Every day | ORAL | Status: DC
  Administered 2015-11-02 – 2015-11-03 (×2): 10 meq via ORAL
  Filled 2015-11-02 (×4): qty 1

## 2015-11-02 NOTE — H&P (Signed)
[facility] History and Physical  Joshua Conrad [id number] DOB: 26-Nov-1946 DOA: 11/01/2015  Referring physician: Dr. Jori Moll. PCP: Joshua Mariscal, MD  Specialists: Follows up at [facility].  Chief Complaint: Hallucinations and confusion.  HPI: Joshua Conrad is a 69 y.o. 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 Patient has been having some visual hallucinations over the last 2 days. Periods of confusion also. Patient has recently moved from [place] to his brother's house over the last 3 months. Prior to that patient was in rehabilitation after being admitted for CHF. In the ER patient's CT head did not show anything acute and on exam patient is nonfocal and presently is alert and awake. The only new medication started was regular recently for nausea vomiting which patient states she has not taken it regularly. On my exam patient is alert and awake. Patient states that he does get confused. Particularly before bed. Patient also has history of OSA and has not been using his sleep apnea secondary to some problem with the straps. Patient will be admitted for further observation. Patient is afebrile and denies any productive cough fever chills nausea vomiting headache. Patient's visual symptoms happen mostly on the right eye but also happens in both eyes. Has had a fall 3 months ago and states symptoms started happen at that time.   Review of Systems: As presented in the history of presenting illness, rest negative.  Past Medical History  Diagnosis Date  . Pulmonary fibrosis ([place])   . MI (mitral incompetence)   . Hypertension   . Hyperlipidemia   . Coronary artery disease   . CHF (congestive heart failure) ([place])   . COPD (chronic obstructive pulmonary disease) [facility])     Past Surgical History  Procedure Laterality Date  . Coronary angioplasty with stent placement    . Cataract extraction     Social History:  reports that he has quit smoking. He does not have any smokeless tobacco history on file. He reports that he does not drink alcohol. His drug history is not on file. Where does patient live home. Can patient participate in ADLs? Yes.  Allergies  Allergen Reactions  . Prednisone     Caused pt to go crazy     Family History:  Family History  Problem Relation Age of Onset  . Hypertension Mother   . Hypertension Father       Prior to Admission medications   Medication Sig Start Date End Date Taking? Authorizing Provider  albuterol (PROVENTIL HFA;VENTOLIN HFA) 108 (90 BASE) MCG/ACT inhaler Inhale 2 puffs into the lungs every 6 (six) hours as needed for wheezing or shortness of breath.   Yes Historical Provider, MD  albuterol (PROVENTIL) (2.5 MG/3ML) 0.083% nebulizer solution Take 2.5 mg by nebulization every 4 (four) hours as needed for wheezing or shortness of breath.   Yes Historical Provider, MD  allopurinol (ZYLOPRIM) 300 MG tablet Take 300 mg by mouth daily.   Yes Historical Provider, MD  aspirin 81 MG tablet Take 81 mg by mouth daily.   Yes Historical Provider, MD  bumetanide (BUMEX) 1 MG tablet Take 1 mg by mouth daily.   Yes Historical Provider, MD  cholecalciferol (VITAMIN D) 400 UNITS TABS tablet Take 400 Units by mouth daily.   Yes Historical Provider, MD  ferrous sulfate  325 (65 FE) MG tablet Take 325 mg by mouth 2 (two) times daily.   Yes Historical Provider, MD  fluticasone (FLONASE) 50 MCG/ACT nasal spray Place 2 sprays into both nostrils at bedtime.   Yes Historical Provider, MD  losartan (COZAAR) 50 MG tablet Take 50 mg by mouth daily.   Yes Historical Provider, MD  metoCLOPramide (REGLAN) 10 MG tablet Take 1 tablet (10 mg total) by mouth every 8 (eight) hours as needed for nausea. 09/30/15  Yes Merrily Pew, MD  montelukast  (SINGULAIR) 10 MG tablet Take 10 mg by mouth at bedtime.   Yes Historical Provider, MD  mycophenolate (CELLCEPT) 500 MG tablet Take 1,500 mg by mouth 2 (two) times daily.   Yes Historical Provider, MD  OXYGEN Inhale 4 L into the lungs continuous.   Yes Historical Provider, MD  potassium chloride (K-DUR) 10 MEQ tablet Take 10 mEq by mouth daily.   Yes Historical Provider, MD  ranitidine (ZANTAC) 150 MG tablet Take 150 mg by mouth 2 (two) times daily.   Yes Historical Provider, MD  simvastatin (ZOCOR) 5 MG tablet Take 5 mg by mouth at bedtime.   Yes Historical Provider, MD  vitamin C (ASCORBIC ACID) 500 MG tablet Take 500 mg by mouth daily.   Yes Historical Provider, MD    Physical Exam: Filed Vitals:   11/01/15 2345 11/02/15 0015 11/02/15 0030 11/02/15 0045  BP: 127/72 119/66 137/82 135/74  Pulse: 76 88 92 80  Temp:      TempSrc:      Resp: 20 16 22 21   SpO2: 100% 100% 100% 100%     General:  Moderately built and nourished.  Eyes: Anicteric. No pallor.  ENT: No discharge from the ears eyes nose and mouth.  Neck: No mass felt.  Cardiovascular: S1-S2 heard.  Respiratory: No rhonchi or crepitations.  Abdomen: Soft nontender bowel sounds present.  Skin: Skin lesions on the lower extremity from Kaposi's sarcoma.  Musculoskeletal: Bilateral lower extremity edema.  Psychiatric: Appears normal.  Neurologic: Alert awake oriented to time place and person. Moves all extremities.  Labs on Admission:  Basic Metabolic Panel:  Recent Labs Lab 11/01/15 1805  NA 141  K 3.9  CL 92*  CO2 44*  GLUCOSE 90  BUN 10  CREATININE 0.94  CALCIUM 9.5   Liver Function Tests:  Recent Labs Lab 11/01/15 1805  AST 19  ALT 13*  ALKPHOS 82  BILITOT 0.6  PROT 5.6*  ALBUMIN 3.1*   No results for input(s): LIPASE, AMYLASE in the last 168 hours. No results for input(s): AMMONIA in the last 168 hours. CBC:  Recent Labs Lab 11/01/15 1805  WBC 3.0*  NEUTROABS 2.0  HGB 10.0*  HCT  33.9*  MCV 98.8  PLT 121*   Cardiac Enzymes: No results for input(s): CKTOTAL, CKMB, CKMBINDEX, TROPONINI in the last 168 hours.  BNP (last 3 results) No results for input(s): BNP in the last 8760 hours.  ProBNP (last 3 results) No results for input(s): PROBNP in the last 8760 hours.  CBG: No results for input(s): GLUCAP in the last 168 hours.  Radiological Exams on Admission: Dg Chest 2 View  11/01/2015  CLINICAL DATA:  69 year old male with confusion and shortness of breath EXAM: CHEST  2 VIEW COMPARISON:  Prior chest x-ray 09/30/2015 FINDINGS: Very low inspiratory volumes with extensive predominantly basilar and peripheral interstitial opacities in a reticular pattern. Overall, there is very little change in the appearance of the lungs compared to prior imaging. Cardiac  and the mediastinal contours are grossly unchanged and partially distorted due to chronic elevation of the left hemidiaphragm. No pneumothorax. The visualized bowel gas pattern is unremarkable. No acute osseous abnormality. IMPRESSION: Stable appearance of advanced pulmonary parenchymal changes most suggestive of advanced pulmonary fibrosis. Persistent chronic elevation of the left hemidiaphragm. Electronically Signed   By: Jacqulynn Cadet M.D.   On: 11/01/2015 18:52   Ct Head Wo Contrast  11/01/2015  CLINICAL DATA:  Initial evaluation for intermittent headaches, visual hallucinations. EXAM: CT HEAD WITHOUT CONTRAST TECHNIQUE: Contiguous axial images were obtained from the base of the skull through the vertex without intravenous contrast. COMPARISON:  None. FINDINGS: Diffuse prominence of the CSF containing spaces is consistent with generalized age-related cerebral atrophy. Patchy hypodensity within the periventricular deep white matter both cerebral hemispheres most consistent with chronic small vessel ischemic disease. Scattered vascular calcifications within the carotid siphons. No acute large vessel territory infarct.  No acute intracranial hemorrhage. No mass lesion, midline shift, or mass effect. No hydrocephalus. No extra-axial fluid collection. Scalp soft tissues within normal limits. No acute abnormality about the orbits. Moderate mucosal thickening within the right maxillary sinus, chronic in nature. Opacity present at the right sphenoid ethmoidal recess. Scattered mucosal thickening within the ethmoidal air cells. Mild opacity within the inferior left mastoid air cells. Calvarium intact. IMPRESSION: 1. No acute intracranial process. 2. Generalized age-related cerebral atrophy with moderate chronic small vessel ischemic disease. 3. Chronic right maxillary sinus disease. Electronically Signed   By: Jeannine Boga M.D.   On: 11/01/2015 20:45    EKG: Independently reviewed. Normal sinus rhythm. Nonspecific T-wave changes.  Assessment/Plan Principal Problem:   Acute delirium Active Problems:   Delirium   COPD (chronic obstructive pulmonary disease) ([facility])   CAD (coronary artery disease)   OSA (obstructive sleep apnea)   CHF (congestive heart failure) ([facility])   Chronic anemia   1. Acute delirium with hallucinations - says this is new for the patient at this time we will rule out any organic causes. I'm unable to reach patient's family to discuss further about patient's complaint. At this time I have ordered MRI brain check ammonia levels. If these are negative then patient will need outpatient referral to ophthalmologist and also psychiatrist. Patient's symptoms could also be due to vision not using OSA due to mechanical problems. 2. Pancytopenia - this appears to be new. Closely follow CBC. Patient is presently afebrile. Creatinine appears to be normal. 3. CHF with unknown EF - continue diuretics and ARB. 4. CAD - denies any chest pain. 5. Pulmonary fibrosis and mycophenolate. 6. COPD presently not wheezing. 7. Recently diagnosed Kaposi's sarcoma and radiation therapy being followed at [facility] as per the patient. 8. OSA - was not able to use his sleep apnea due to problems with straps.  I have personally reviewed patient's chest x-ray and EKG.   DVT Prophylaxis Lovenox.  Code Status: DO NOT RESUSCITATE.  Family Communication: Discussed with patient.  Disposition Plan: Admit for observation.    Shirelle Tootle N. [facility] Pager [phone].  If 7PM-7AM, please contact night-coverage [email or web address] Password [email or web address] 11/02/2015, 1:01 AM

## 2015-11-02 NOTE — Evaluation (Signed)
Occupational Therapy Evaluation Patient Details Name: Joshua Conrad MRN: [id number] DOB: 1946-08-12 Today's Date: 11/02/2015    History of Present Illness Joshua Conrad is a 69 y.o. 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 He has decreased mobility, decreased balance, decreased safety awareness, increased pain in foot and his sats drop with activity (71% at rest on 4 liter shortly after getting back from [facility] and of note O2 only in 1 nare; placed O2 tubing in both nares and up to 81%, turned O2 up to 5 liters and O2 sats up to 92% with cues for purse lipped breathing; pt went from sit to supine on 5 liters of O2 and sats dropped 79% encouraged to purse lip breath again and up to high 80's). He is unable at this time to safely care for himself, nor does he have the 24 hour physical care he needs. He will benefit from continued OT services to work towards increased independence and safety with basic ADLs.    Follow Up Recommendations  SNF    Equipment Recommendations  None recommended by OT       Precautions / Restrictions Precautions Precautions: Fall Precaution Comments: monitor O2 sats Restrictions Weight Bearing Restrictions: No      Mobility Bed Mobility Overal bed mobility: Needs Assistance Bed Mobility: Sit to Supine       Sit to supine: Supervision   General bed mobility comments: increased time and drop in O2 sats from low 90's to high 70's on 5 liters of O2  Transfers Overall transfer level: Needs assistance   Transfers: Sit to/from Stand;Stand Pivot Transfers Sit to Stand: Mod assist;+2 physical  assistance Stand pivot transfers: Mod assist;+2 physical assistance            Balance Overall balance assessment: Needs assistance Sitting-balance support: No upper extremity supported;Feet supported Sitting balance-Leahy Scale: Good                                      ADL Overall ADL's : Needs assistance/impaired Eating/Feeding: Independent;Sitting   Grooming: Set up;Sitting   Upper Body Bathing: Set up;Sitting   Lower Body Bathing: Set up;Supervison/ safety (with Mod A +2 sit<>stand)   Upper Body Dressing : Set up;Sitting   Lower Body Dressing: Set up;Supervision/safety (with Mod A +2 sit<>stand)   Toilet Transfer: Moderate assistance;+2 for physical assistance;Stand-pivot;BSC   Toileting- Clothing Manipulation and Hygiene: Min guard;Sitting/lateral lean                         Pertinent Vitals/Pain Pain Assessment: Faces Faces Pain Scale: Hurts little more Pain Location: left heel Pain Descriptors / Indicators: Sore Pain Intervention(s): Monitored during session;Repositioned     Hand Dominance Right   Extremity/Trunk Assessment Upper Extremity Assessment Upper Extremity Assessment: Generalized weakness   Lower Extremity Assessment Lower Extremity Assessment: Defer to PT evaluation       Communication Communication Communication: No difficulties   Cognition Arousal/Alertness: Awake/alert Behavior During Therapy: WFL for tasks assessed/performed Overall Cognitive Status: Impaired/Different from baseline Area of Impairment:  Problem solving             Problem Solving: Slow processing General Comments: Trying to have a discussion about pt using his 3n1 as a BSC and not over toliet at home (although home is not the best option) due to his extreme weakness right now and he kept going back to the fact that if the 3n1 was not over the toilet then the toilet would be too low for him to use even though I was saying he did not need  to use the toilet, but rather the 3n1 next to his bed.              Home Living Family/patient expects to be discharged to:: Skilled nursing facility Living Arrangements: Other relatives Available Help at Discharge: Family;Available PRN/intermittently               Bathroom Shower/Tub: Tub/shower unit;Curtain         Home Equipment: Environmental consultant - 2 wheels;Bedside commode;Tub bench          Prior Functioning/Environment          Comments: says family sometimes A him with B/D depends on time limit    OT Diagnosis: Generalized weakness;Cognitive deficits;Acute pain   OT Problem List: Decreased strength;Decreased activity tolerance;Impaired balance (sitting and/or standing);Cardiopulmonary status limiting activity;Pain;Decreased knowledge of use of DME or AE   OT Treatment/Interventions: Self-care/ADL training;Energy conservation;DME and/or AE instruction;Balance training;Patient/family education;Therapeutic activities    OT Goals(Current goals can be found in the care plan section) Acute Rehab OT Goals Patient Stated Goal: to have more help OT Goal Formulation: With patient Time For Goal Achievement: 11/16/15 Potential to Achieve Goals: Good  OT Frequency: Min 2X/week   Barriers to D/C: Decreased caregiver support          Co-evaluation PT/OT/SLP Co-Evaluation/Treatment: Yes (partial) Reason for Co-Treatment: For patient/therapist safety   OT goals addressed during session: ADL's and self-care;Strengthening/ROM      End of Session Nurse Communication: Mobility status (+2 mod A)  Activity Tolerance:  (limited by dropping O2 sats and DOE) Patient left: in bed;with call bell/phone within reach;with bed alarm set   Time: [id number] OT Time Calculation (min): 23 min Charges:  OT General Charges $OT Visit: 1 Procedure OT Evaluation $Initial OT Evaluation Tier I: 1 Procedure G-Codes: OT G-codes **NOT FOR INPATIENT CLASS** Functional Assessment Tool Used:  Clincal observation Functional Limitation: Self care Self Care Current Status [id number]): At least 80 percent but less than 100 percent impaired, limited or restricted Self Care Goal Status [id number]): At least 40 percent but less than 60 percent impaired, limited or restricted  Almon Register [id number] 11/02/2015, 3:33 PM

## 2015-11-02 NOTE — Progress Notes (Signed)
[facility]  Patient Status: Active (receiving services up to time of hospitalization)  [facility] is providing the following services: RN, PT, OT, MSW and HHA  If patient discharges after hours, please call [phone].   Joshua Conrad 11/02/2015, 12:19 PM

## 2015-11-02 NOTE — ED Notes (Signed)
Dr. Chyrl Civatte, admitting MD at the bedside.

## 2015-11-02 NOTE — ED Notes (Signed)
Received call from MRI, patient is ready to transport. Report has been called to receiving floor. Patient is to transfer to MRI, then transfer up to floor. Receiving RN, Edd Arbour is aware of plan.

## 2015-11-02 NOTE — Evaluation (Signed)
Physical Therapy Evaluation Patient Details Name: Joshua Conrad MRN: [id number] DOB: 09-27-1946 Today's Date: 11/02/2015   History of Present Illness  Joshua Conrad is a 69 y.o. 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  His home situation is with no regular help so is now going to need more therapy and assist.  Have requested SNF care but may also be at [facility] discretion as this is his prior care level.    Follow Up Recommendations SNF    Equipment Recommendations  None recommended by PT (await disposition)    Recommendations for Other Services Rehab consult     Precautions / Restrictions Precautions Precautions: Fall Precaution Comments: monitor O2 sats Restrictions Weight Bearing Restrictions: No Other Position/Activity Restrictions: desaturates with all activity on 5L O2       Mobility  Bed Mobility Overal bed mobility: Needs Assistance Bed Mobility: Sit to Supine       Sit to supine: Supervision   General bed mobility comments: sats dropped into the 70's with the effort to get BSC to bed, then bed to supine  Transfers Overall transfer level: Needs assistance Equipment used: Rolling walker (2 wheeled);2 person hand held assist Transfers: Sit to/from [facility] Sit to Stand: Mod assist;+2 physical assistance;+2 safety/equipment Stand pivot transfers: Mod assist;+2 safety/equipment;+2 physical assistance       General transfer comment: Pt is home alone part of the day and will not be able to manage the transition without assist, may need 24/7  Ambulation/Gait             General Gait Details:  unable to do more than sidestep  Stairs            Wheelchair Mobility    Modified Rankin (Stroke Patients Only)       Balance Overall balance assessment: Needs assistance Sitting-balance support: Feet supported Sitting balance-Leahy Scale: Good     Standing balance support: Bilateral upper extremity supported Standing balance-Leahy Scale: Poor                               Pertinent Vitals/Pain Pain Assessment: Faces Pain Score: 4  Faces Pain Scale: Hurts little more Pain Location: L heel Pain Descriptors / Indicators: Tender Pain Intervention(s): Monitored during session;Repositioned    Home Living Family/patient expects to be discharged to:: Private residence Living Arrangements: Other relatives Available Help at Discharge: Family;Available PRN/intermittently Type of Home: House Home Access: Stairs to enter   Entrance Stairs-Number of Steps: 3 Home Layout: One level Home Equipment: Walker - 2 wheels;Bedside commode;Tub bench      Prior Function Level of Independence: Independent (uses walls for support)         Comments: says family sometimes A him with B/D depends on time limit     Hand Dominance   Dominant Hand: Right    Extremity/Trunk Assessment   Upper Extremity Assessment: Generalized weakness           Lower Extremity Assessment: Generalized weakness      Cervical / Trunk Assessment: Kyphotic  Communication   Communication: No difficulties  Cognition Arousal/Alertness: Awake/alert Behavior During Therapy:  WFL for tasks assessed/performed Overall Cognitive Status: Impaired/Different from baseline Area of Impairment: Safety/judgement;Problem solving         Safety/Judgement: Decreased awareness of safety;Decreased awareness of deficits   Problem Solving: Slow processing;Requires verbal cues General Comments: Pt perseverates on certain ideas and cannot think through a problem well    General Comments General  comments (skin integrity, edema, etc.): Pt was evaluated with limited tolerance for mobility and O2 sat drops wtih all activity.  Pt is not able to even get from bed to supine without dropping to 79%.  Will continue on with PT to focus on strength and control but will need continual help to be able to leave hosp    Exercises        Assessment/Plan    PT Assessment Patient needs continued PT services  PT Diagnosis Generalized weakness;Difficulty walking   PT Problem List Decreased strength;Decreased range of motion;Decreased activity tolerance;Decreased balance;Decreased mobility;Decreased coordination;Decreased knowledge of use of DME;Decreased safety awareness;Cardiopulmonary status limiting activity;Decreased skin integrity;Pain  PT Treatment Interventions DME instruction;Gait training;Stair training;Functional mobility training;Therapeutic activities;Therapeutic exercise;Balance training;Neuromuscular re-education;Patient/family education   PT Goals (Current goals can be found in the Care Plan section) Acute Rehab PT Goals Patient Stated Goal: to have more help PT Goal Formulation: With patient Time For Goal Achievement: 11/16/15 Potential to Achieve Goals: Good    Frequency Min 2X/week   Barriers to discharge Inaccessible home environment;Decreased caregiver support Home alone in house with steps    Co-evaluation PT/OT/SLP Co-Evaluation/Treatment: Yes Reason for Co-Treatment: Complexity of the patient's impairments (multi-system involvement);For patient/therapist safety PT goals addressed during session: Mobility/safety with mobility;Proper use of DME;Balance OT goals addressed during session: ADL's and self-care       End of Session Equipment Utilized During Treatment: Gait belt;Oxygen Activity Tolerance: Patient limited by fatigue;Treatment limited secondary to medical complications (Comment) (O2 sat drops) Patient left: in bed;with call bell/phone within reach;Other  (comment) (OT was still evaluating) Nurse Communication: Mobility status;Precautions;Other (comment) (Medical concerns)    Functional Assessment Tool Used: clinical judgment Functional Limitation: Mobility: Walking and moving around Mobility: Walking and Moving Around Current Status [phone]): At least 60 percent but less than 80 percent impaired, limited or restricted Mobility: Walking and Moving Around Goal Status [phone]): At least 60 percent but less than 80 percent impaired, limited or restricted    Time: [id number] PT Time Calculation (min) (ACUTE ONLY): 48 min   Charges:   PT Evaluation $Initial PT Evaluation Tier I: 1 Procedure PT Treatments $Therapeutic Activity: 8-22 mins   PT G Codes:   PT G-Codes **NOT FOR INPATIENT CLASS** Functional Assessment Tool Used: clinical judgment Functional Limitation: Mobility: Walking and moving around Mobility: Walking and Moving Around Current Status [id number]): At least 60 percent but less than 80 percent impaired, limited or restricted Mobility: Walking and Moving Around Goal Status [phone]): At least 60 percent but less than 80 percent impaired, limited or restricted    Ramond Dial 11/02/2015, 4:18 PM   Mee Hives, PT MS Acute Rehab Dept. Number: [facility] [id number] and [place] [phone]

## 2015-11-02 NOTE — Progress Notes (Signed)
CPAP ordered as requested through [facility]; request for Outpatient Sleep Study made for Nov 30,2016 at 8 pm; [name] [phone]

## 2015-11-02 NOTE — Progress Notes (Signed)
[facility] Progress Note   Joshua Conrad  [id number]  DOB: 1946/02/05  DOA: 11/01/2015 PCP: Sandi Mariscal, MD  Brief narrative: Joshua Conrad is a 69 y.o. 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 He was sent to the hospital by his sister and brother in law for hallucinations which started yesterday. He has had vomiting recently and per his SIL he is often nauseated and has dry heaves. He has been on Reglan recently for his nausea and states it causes diarrhea. His SIL states he had diarrhea even before the Reglan. He also has OSA but his machine is broken and medicare will not pay for a new one yet.   Subjective: Currently has no complaints. He ate his breakfast without trouble. Has no abdominal pain or diarrhea today.   Assessment/Plan: Principal Problem:   Acute delirium - MRI brain negative - symptoms waxing and waning- no agitation- no new medications - no underlying infection - dementia?  follow  Active Problems:     COPD (chronic obstructive pulmonary disease)   - currently stable  Vomiting/ diarrhea - d/c Reglan and start Zofran PRN- no GI issues today  Pancytopenia - check anemia panel and peripheral smear  Kaposi's Sarcoma - recently received radiation-   Pulm fibrosis - cont Mycophenolate and O2    CAD (coronary artery disease) -cont ASA    OSA (obstructive sleep apnea) - CPAP ordered    CHF (congestive heart failure) - undetermined if diastolic or systolic - no ECHO in Epic - cont Bumex and ARB      Code Status:     Code Status Orders        Start     Ordered   11/02/15 0249  Do not attempt resuscitation (DNR)   Continuous    Question Answer Comment  In the event of cardiac or respiratory ARREST Do not call a "code blue"   In the event of cardiac or respiratory ARREST Do not perform Intubation, CPR, defibrillation or ACLS   In  the event of cardiac or respiratory ARREST Use medication by any route, position, wound care, and other measures to relive pain and suffering. May use oxygen, suction and manual treatment of airway obstruction as needed for comfort.      11/02/15 0248    Advance Directive Documentation        Most Recent Value   Type of Advance Directive  Out of facility DNR (pink MOST or yellow form), Healthcare Power of Attorney, Living will   Pre-existing out of facility DNR order (yellow form or pink MOST form)     "MOST" Form in Place?       Family Communication: sister in law- Judy Disposition Plan: SNF recommended by PT DVT prophylaxis: Lovenox Consultants: none Procedures:none    Antibiotics: Anti-infectives    None      Objective: Filed Weights   11/02/15 0254  Weight: 97.297 kg (214 lb 8 oz)    Intake/Output Summary (Last 24 hours) at 11/02/15 1637 Last data filed at 11/02/15 1611  Gross per 24 hour  Intake    360 ml  Output    925 ml  Net   -565 ml     Vitals Filed Vitals:   11/02/15 0100 11/02/15 0254 11/02/15 1106 11/02/15 1516  BP: 134/72 137/79 127/77   Pulse: 77 87 86   Temp:  98.4 F (36.9 C) 98.5 F (36.9 C)  TempSrc:  Oral Oral   Resp: 18 20 20    Height:  6\' 2"  (1.88 m)    Weight:  97.297 kg (214 lb 8 oz)    SpO2: 100% 96% 95% 71%    Exam:  General:  Pt is alert, not in acute distress  HEENT: No icterus, No thrush, oral mucosa moist  Cardiovascular: regular rate and rhythm, S1/S2 No murmur  Respiratory: clear to auscultation bilaterally   Abdomen: Soft, +Bowel sounds, non tender, non distended, no guarding  MSK: No LE edema, cyanosis or clubbing  Data Reviewed: Basic Metabolic Panel:  Recent Labs Lab 11/01/15 1805 11/02/15 0840  NA 141 142  K 3.9 3.7  CL 92* 95*  CO2 44* 44*  GLUCOSE 90 121*  BUN 10 10  CREATININE 0.94 1.00  CALCIUM 9.5 9.0   Liver Function Tests:  Recent Labs Lab 11/01/15 1805  AST 19  ALT 13*  ALKPHOS 82   BILITOT 0.6  PROT 5.6*  ALBUMIN 3.1*   No results for input(s): LIPASE, AMYLASE in the last 168 hours.  Recent Labs Lab 11/02/15 0556  AMMONIA 35   CBC:  Recent Labs Lab 11/01/15 1805 11/02/15 0840  WBC 3.0* 2.3*  NEUTROABS 2.0  --   HGB 10.0* 8.6*  HCT 33.9* 28.5*  MCV 98.8 97.6  PLT 121* 86*   Cardiac Enzymes: No results for input(s): CKTOTAL, CKMB, CKMBINDEX, TROPONINI in the last 168 hours. BNP (last 3 results) No results for input(s): BNP in the last 8760 hours.  ProBNP (last 3 results) No results for input(s): PROBNP in the last 8760 hours.  CBG: No results for input(s): GLUCAP in the last 168 hours.  No results found for this or any previous visit (from the past 240 hour(s)).   Studies: Dg Chest 2 View  11/01/2015  CLINICAL DATA:  69 year old male with confusion and shortness of breath EXAM: CHEST  2 VIEW COMPARISON:  Prior chest x-ray 09/30/2015 FINDINGS: Very low inspiratory volumes with extensive predominantly basilar and peripheral interstitial opacities in a reticular pattern. Overall, there is very little change in the appearance of the lungs compared to prior imaging. Cardiac and the mediastinal contours are grossly unchanged and partially distorted due to chronic elevation of the left hemidiaphragm. No pneumothorax. The visualized bowel gas pattern is unremarkable. No acute osseous abnormality. IMPRESSION: Stable appearance of advanced pulmonary parenchymal changes most suggestive of advanced pulmonary fibrosis. Persistent chronic elevation of the left hemidiaphragm. Electronically Signed   By: Jacqulynn Cadet M.D.   On: 11/01/2015 18:52   Ct Head Wo Contrast  11/01/2015  CLINICAL DATA:  Initial evaluation for intermittent headaches, visual hallucinations. EXAM: CT HEAD WITHOUT CONTRAST TECHNIQUE: Contiguous axial images were obtained from the base of the skull through the vertex without intravenous contrast. COMPARISON:  None. FINDINGS: Diffuse  prominence of the CSF containing spaces is consistent with generalized age-related cerebral atrophy. Patchy hypodensity within the periventricular deep white matter both cerebral hemispheres most consistent with chronic small vessel ischemic disease. Scattered vascular calcifications within the carotid siphons. No acute large vessel territory infarct. No acute intracranial hemorrhage. No mass lesion, midline shift, or mass effect. No hydrocephalus. No extra-axial fluid collection. Scalp soft tissues within normal limits. No acute abnormality about the orbits. Moderate mucosal thickening within the right maxillary sinus, chronic in nature. Opacity present at the right sphenoid ethmoidal recess. Scattered mucosal thickening within the ethmoidal air cells. Mild opacity within the inferior left mastoid air cells. Calvarium intact. IMPRESSION: 1. No acute  intracranial process. 2. Generalized age-related cerebral atrophy with moderate chronic small vessel ischemic disease. 3. Chronic right maxillary sinus disease. Electronically Signed   By: Jeannine Boga M.D.   On: 11/01/2015 20:45   Mr Brain Wo Contrast  11/02/2015  CLINICAL DATA:  Initial evaluation for acute confusion with hallucinations. EXAM: MRI HEAD WITHOUT CONTRAST TECHNIQUE: Multiplanar, multiecho pulse sequences of the brain and surrounding structures were obtained without intravenous contrast. COMPARISON:  Prior CT from 11/01/2015. FINDINGS: Age-related cerebral atrophy present. T2/FLAIR hyperintensity within the periventricular and deep white matter most likely related to chronic small vessel ischemic disease, mild nature. No areas of chronic infarction. No abnormal foci of restricted diffusion to suggest acute intracranial infarct. Gray-white matter differentiation maintained. Normal intravascular flow voids preserved. No acute or chronic intracranial hemorrhage. No mass lesion, midline shift, or mass effect. No hydrocephalus. No extra-axial fluid  collection. Craniocervical junction within normal limits. Pituitary gland within normal limits. No acute abnormality about the orbits. Moderate mucosal thickening present within the right max O sinus. More mild mucosal thickening within the ethmoidal air cells and left maxillary sinus. Retention cyst within the right sphenoid sinus. A small left mastoid effusion noted. Trace opacity within inferior right mastoid air cells. Inner ear structures normal. Bone marrow signal intensity within normal limits. Scalp soft tissues demonstrate no acute abnormality. Probable small sebaceous cyst noted within the site days fat of the suboccipital region. IMPRESSION: 1. No acute intracranial process. 2. Generalized age-related cerebral atrophy with mild chronic small vessel ischemic disease. 3. Moderate right maxillary sinus disease. 4. Small left mastoid effusion. Electronically Signed   By: Jeannine Boga M.D.   On: 11/02/2015 02:30    Scheduled Meds:  Scheduled Meds: . allopurinol  300 mg Oral Daily  . aspirin  81 mg Oral Daily  . bumetanide  1 mg Oral Daily  . cholecalciferol  400 Units Oral Daily  . enoxaparin (LOVENOX) injection  40 mg Subcutaneous Q24H  . famotidine  20 mg Oral BID  . ferrous sulfate  325 mg Oral BID WC  . fluticasone  2 spray Each Nare QHS  . losartan  50 mg Oral Daily  . montelukast  10 mg Oral QHS  . mycophenolate  1,500 mg Oral BID  . potassium chloride  10 mEq Oral Daily  . simvastatin  5 mg Oral QHS  . vitamin C  500 mg Oral Daily   Continuous Infusions:   Time spent on care of this patient: 84 min   Grenelefe, MD 11/02/2015, 4:37 PM    [facility] Office  [phone] Pager - Text Page per [email or web address] If 7PM-7AM, please contact night-coverage [email or web address]

## 2015-11-03 DIAGNOSIS — R410 Disorientation, unspecified: Secondary | ICD-10-CM | POA: Diagnosis not present

## 2015-11-03 LAB — CBC
HCT: 30.7 % — ABNORMAL LOW (ref 39.0–52.0)
HEMOGLOBIN: 8.9 g/dL — AB (ref 13.0–17.0)
MCH: 28.9 pg (ref 26.0–34.0)
MCHC: 29 g/dL — ABNORMAL LOW (ref 30.0–36.0)
MCV: 99.7 fL (ref 78.0–100.0)
PLATELETS: 109 10*3/uL — AB (ref 150–400)
RBC: 3.08 MIL/uL — AB (ref 4.22–5.81)
RDW: 13.5 % (ref 11.5–15.5)
WBC: 2.4 10*3/uL — AB (ref 4.0–10.5)

## 2015-11-03 LAB — FOLATE: Folate: 12.3 ng/mL (ref >5.9)

## 2015-11-03 LAB — RETICULOCYTES
RBC.: 3.08 MIL/uL — AB (ref 4.22–5.81)
RETIC COUNT ABSOLUTE: 83.2 10*3/uL (ref 19.0–186.0)
RETIC CT PCT: 2.7 % (ref 0.4–3.1)

## 2015-11-03 LAB — IRON AND TIBC
Iron: 62 ug/dL (ref 45–182)
Saturation Ratios: 21 % (ref 17.9–39.5)
TIBC: 300 ug/dL (ref 250–450)
UIBC: 238 ug/dL

## 2015-11-03 LAB — VITAMIN B12: Vitamin B-12: 319 pg/mL (ref 180–914)

## 2015-11-03 LAB — FERRITIN: Ferritin: 126 ng/mL (ref 24–336)

## 2015-11-03 MED ORDER — FERROUS SULFATE 325 (65 FE) MG PO TABS
325.0000 mg | ORAL_TABLET | Freq: Two times a day (BID) | ORAL | Status: DC
  Administered 2015-11-03 (×2): 325 mg via ORAL
  Filled 2015-11-03 (×2): qty 1

## 2015-11-03 NOTE — Progress Notes (Signed)
Discharge instructions completed.  Med regimen reviewed with patient.  Iv discontinued.

## 2015-11-03 NOTE — Progress Notes (Signed)
CSW order received to assist patient with SNF placement per PT recommendation. Patient is observation status and does not meet 3 day inpatient stay criteria. Also- he refuses SNF placement and wants to return home with his brother and sister-in-law.  Notified unit RNCM of above. CSW signing off as patient denies any CSW needs.  Lorie Phenix. Pauline Good, [place]

## 2015-11-03 NOTE — Progress Notes (Deleted)
Physician Discharge Summary  Joshua Conrad [id number] DOB: 14-Nov-1946 DOA: 11/01/2015  PCP: Sandi Mariscal, MD  Admit date: 11/01/2015 Discharge date: 11/03/2015  Time spent: 40 minutes  Recommendations for Outpatient Follow-up:  1. Will need BMET and CBC in 1 week 2. recommend discussion of Mycophenolate use and Reglan use with [facility] MD's  3. Patient has been cleared to d/c home 11/03/15 with [facility] following 4. CPAP and Sleep study to be arranged as an OP  Discharge Diagnoses:  Principal Problem:   Acute delirium Active Problems:   Delirium   COPD (chronic obstructive pulmonary disease) (HCC)   CAD (coronary artery disease)   OSA (obstructive sleep apnea)   CHF (congestive heart failure) (HCC)   Chronic anemia   Discharge Condition: improved  Diet recommendation: HH low salt  Filed Weights   11/02/15 0254 11/03/15 0729  Weight: 97.297 kg (214 lb 8 oz) 91.8 kg (202 lb 6.1 oz)    History of present illness:  69 y/o ? 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 H  Admitted [facility] by his sister and brother in law for hallucinations which started yesterday. He has had vomiting recently and per his SIL he is often nauseated and has dry heaves. He has been on Reglan recently for his nausea and states it causes diarrhea. His SIL states he had diarrhea even before the Reglan. He also has OSA but his machine is broken and medicare will not pay for a new one yet.   Hospital Course:  Acute delirium - MRI brain negative - symptoms waxing and waning- no agitation- no new medications - no underlying infection - dementia?  Vs medication effect of Reglan and probable chr hypercapnia with not being on CPAP -his mental status is much improved and he is much more cognizant this am    COPD (chronic obstructive pulmonary disease)  - currently stable  Vomiting/ diarrhea - d/c  Reglan and start Zofran PRN- no GI issues today  Pancytopenia - check anemia panel and peripheral smear -anemia panel showed no anomalies -likely 2/2 to mycopehnolate use  Kaposi's Sarcoma - recently received radiation -OP follow up   Pulm fibrosis - cont Mycophenolate and O2 4 liters   CAD (coronary artery disease) -cont ASA   OSA (obstructive sleep apnea) - CPAP ordered -Sleep study to be done as OP   CHF (congestive heart failure) - undetermined if diastolic or systolic - no ECHO in Epic - cont Bumex and ARB  Discharge Exam: Filed Vitals:   11/03/15 0013 11/03/15 0554  BP: 109/58 128/72  Pulse: 81 75  Temp: 98.1 F (36.7 C) 98.2 F (36.8 C)  Resp: 20 18   Alert conversant oriented and in NAD General: alert pleasant Cardiovascular: s1 s2no m/r/g Respiratory: clear ant but crackles posteriorly  Discharge Instructions    Current Discharge Medication List    CONTINUE these medications which have NOT CHANGED   Details  albuterol (PROVENTIL HFA;VENTOLIN HFA) 108 (90 BASE) MCG/ACT inhaler Inhale 2 puffs into the lungs every 6 (six) hours as needed for wheezing or shortness of breath.    albuterol (PROVENTIL) (2.5 MG/3ML) 0.083% nebulizer solution Take 2.5 mg by nebulization every 4 (four) hours as needed for wheezing or shortness of breath.    allopurinol (ZYLOPRIM) 300 MG tablet Take 300 mg by mouth daily.    aspirin 81 MG tablet Take 81 mg by mouth  daily.    bumetanide (BUMEX) 1 MG tablet Take 1 mg by mouth daily.    cholecalciferol (VITAMIN D) 400 UNITS TABS tablet Take 400 Units by mouth daily.    ferrous sulfate 325 (65 FE) MG tablet Take 325 mg by mouth 2 (two) times daily.    fluticasone (FLONASE) 50 MCG/ACT nasal spray Place 2 sprays into both nostrils at bedtime.    losartan (COZAAR) 50 MG tablet Take 50 mg by mouth daily.    montelukast (SINGULAIR) 10 MG tablet Take 10 mg by mouth at bedtime.    mycophenolate (CELLCEPT) 500 MG tablet Take  1,500 mg by mouth 2 (two) times daily.    OXYGEN Inhale 4 L into the lungs continuous.    potassium chloride (K-DUR) 10 MEQ tablet Take 10 mEq by mouth daily.    ranitidine (ZANTAC) 150 MG tablet Take 150 mg by mouth 2 (two) times daily.    simvastatin (ZOCOR) 5 MG tablet Take 5 mg by mouth at bedtime.    vitamin C (ASCORBIC ACID) 500 MG tablet Take 500 mg by mouth daily.      STOP taking these medications     metoCLOPramide (REGLAN) 10 MG tablet        Allergies  Allergen Reactions  . Prednisone     Caused pt to go crazy    Follow-up Information    Follow up with [facility] On 11/10/2015.   Specialty:  Sleep Medicine   Why:  at 8 pm for your sleep study test.   Contact information:   [place], [place] [id number] [place] [phone]       The results of significant diagnostics from this hospitalization (including imaging, microbiology, ancillary and laboratory) are listed below for reference.    Significant Diagnostic Studies: Dg Chest 2 View  11/01/2015  CLINICAL DATA:  69 year old male with confusion and shortness of breath EXAM: CHEST  2 VIEW COMPARISON:  Prior chest x-ray 09/30/2015 FINDINGS: Very low inspiratory volumes with extensive predominantly basilar and peripheral interstitial opacities in a reticular pattern. Overall, there is very little change in the appearance of the lungs compared to prior imaging. Cardiac and the mediastinal contours are grossly unchanged and partially distorted due to chronic elevation of the left hemidiaphragm. No pneumothorax. The visualized bowel gas pattern is unremarkable. No acute osseous abnormality. IMPRESSION: Stable appearance of advanced pulmonary parenchymal changes most suggestive of advanced pulmonary fibrosis. Persistent chronic elevation of the left hemidiaphragm. Electronically Signed   By: Jacqulynn Cadet M.D.   On: 11/01/2015 18:52   Ct Head Wo  Contrast  11/01/2015  CLINICAL DATA:  Initial evaluation for intermittent headaches, visual hallucinations. EXAM: CT HEAD WITHOUT CONTRAST TECHNIQUE: Contiguous axial images were obtained from the base of the skull through the vertex without intravenous contrast. COMPARISON:  None. FINDINGS: Diffuse prominence of the CSF containing spaces is consistent with generalized age-related cerebral atrophy. Patchy hypodensity within the periventricular deep white matter both cerebral hemispheres most consistent with chronic small vessel ischemic disease. Scattered vascular calcifications within the carotid siphons. No acute large vessel territory infarct. No acute intracranial hemorrhage. No mass lesion, midline shift, or mass effect. No hydrocephalus. No extra-axial fluid collection. Scalp soft tissues within normal limits. No acute abnormality about the orbits. Moderate mucosal thickening within the right maxillary sinus, chronic in nature. Opacity present at the right sphenoid ethmoidal recess. Scattered mucosal thickening within the ethmoidal air cells. Mild opacity within the inferior left mastoid  air cells. Calvarium intact. IMPRESSION: 1. No acute intracranial process. 2. Generalized age-related cerebral atrophy with moderate chronic small vessel ischemic disease. 3. Chronic right maxillary sinus disease. Electronically Signed   By: Jeannine Boga M.D.   On: 11/01/2015 20:45   Mr Brain Wo Contrast  11/02/2015  CLINICAL DATA:  Initial evaluation for acute confusion with hallucinations. EXAM: MRI HEAD WITHOUT CONTRAST TECHNIQUE: Multiplanar, multiecho pulse sequences of the brain and surrounding structures were obtained without intravenous contrast. COMPARISON:  Prior CT from 11/01/2015. FINDINGS: Age-related cerebral atrophy present. T2/FLAIR hyperintensity within the periventricular and deep white matter most likely related to chronic small vessel ischemic disease, mild nature. No areas of chronic  infarction. No abnormal foci of restricted diffusion to suggest acute intracranial infarct. Gray-white matter differentiation maintained. Normal intravascular flow voids preserved. No acute or chronic intracranial hemorrhage. No mass lesion, midline shift, or mass effect. No hydrocephalus. No extra-axial fluid collection. Craniocervical junction within normal limits. Pituitary gland within normal limits. No acute abnormality about the orbits. Moderate mucosal thickening present within the right max O sinus. More mild mucosal thickening within the ethmoidal air cells and left maxillary sinus. Retention cyst within the right sphenoid sinus. A small left mastoid effusion noted. Trace opacity within inferior right mastoid air cells. Inner ear structures normal. Bone marrow signal intensity within normal limits. Scalp soft tissues demonstrate no acute abnormality. Probable small sebaceous cyst noted within the site days fat of the suboccipital region. IMPRESSION: 1. No acute intracranial process. 2. Generalized age-related cerebral atrophy with mild chronic small vessel ischemic disease. 3. Moderate right maxillary sinus disease. 4. Small left mastoid effusion. Electronically Signed   By: Jeannine Boga M.D.   On: 11/02/2015 02:30    Microbiology: No results found for this or any previous visit (from the past 240 hour(s)).   Labs: Basic Metabolic Panel:  Recent Labs Lab 11/01/15 1805 11/02/15 0840 11/02/15 1858  NA 141 142 141  K 3.9 3.7 3.6  CL 92* 95* 93*  CO2 44* 44* 43*  GLUCOSE 90 121* 128*  BUN 10 10 10   CREATININE 0.94 1.00 1.03  CALCIUM 9.5 9.0 9.2   Liver Function Tests:  Recent Labs Lab 11/01/15 1805  AST 19  ALT 13*  ALKPHOS 82  BILITOT 0.6  PROT 5.6*  ALBUMIN 3.1*   No results for input(s): LIPASE, AMYLASE in the last 168 hours.  Recent Labs Lab 11/02/15 0556  AMMONIA 35   CBC:  Recent Labs Lab 11/01/15 1805 11/02/15 0840 11/03/15 0333  WBC 3.0* 2.3* 2.4*   NEUTROABS 2.0  --   --   HGB 10.0* 8.6* 8.9*  HCT 33.9* 28.5* 30.7*  MCV 98.8 97.6 99.7  PLT 121* 86* 109*   Cardiac Enzymes: No results for input(s): CKTOTAL, CKMB, CKMBINDEX, TROPONINI in the last 168 hours. BNP: BNP (last 3 results) No results for input(s): BNP in the last 8760 hours.  ProBNP (last 3 results) No results for input(s): PROBNP in the last 8760 hours.  CBG: No results for input(s): GLUCAP in the last 168 hours.     SignedNita Sells  [facility] 11/03/2015, 9:36 AM

## 2015-11-03 NOTE — Care Management Obs Status (Signed)
[facility] NOTIFICATION   Patient Details  Name: Joshua Conrad MRN: [id number] Date of Birth: 07/06/1946   Medicare Observation Status Notification Given:       Royston Bake, RN 11/03/2015, 1:22 PM

## 2015-11-03 NOTE — Progress Notes (Signed)
Occupational Therapy Treatment Patient Details Name: Joshua Conrad MRN: [id number] DOB: 28-Apr-1946 Today's Date: 11/03/2015    History of present illness Joshua Conrad is a 69 y.o. male with history of coronary fibrosis on mycophenolate, COPD, CHF, CAD, Kaposi's sarcoma of the lower extremity has been receiving radiation therapy last 1 week was brought to the ER after patient's family found that patient has been having hallucinations and confusion.MRI negative   OT comments  Pt is doing quite a bit better today with mobility and O2 sats staying up. I have changed my recommendation to [facility] with 24 hour S/prn A.   Follow Up Recommendations  Home health OT    Equipment Recommendations  None recommended by OT       Precautions / Restrictions Precautions Precautions: Fall Precaution Comments: monitor O2 sats (today much better--never lower than 90 on 5 liters of O2) Restrictions Weight Bearing Restrictions: No       Mobility Bed Mobility Overal bed mobility: Modified Independent Bed Mobility: Supine to Sit     Supine to sit: Modified independent (Device/Increase time);HOB elevated (use of rail)        Transfers Overall transfer level: Needs assistance Equipment used: Rolling walker (2 wheeled) Transfers: Sit to/from [facility] Sit to Stand: Min guard Stand pivot transfers: Min guard       General transfer comment: with RW and elevated surface    Balance Overall balance assessment: Needs assistance Sitting-balance support: Feet supported;No upper extremity supported Sitting balance-Leahy Scale: Good     Standing balance support: Bilateral upper extremity supported Standing balance-Leahy Scale: Poor Standing balance comment: reliance on RW                   ADL Overall ADL's : Needs assistance/impaired                         Toilet Transfer: Min guard;Ambulation;RW (bed>around to other side> back around to recliner> back around to  smaller recliner on other side)   Toileting- Clothing Manipulation and Hygiene: Min guard;Sitting/lateral lean                          Cognition   Behavior During Therapy: [facility] for tasks assessed/performed              Safety/Judgement: Decreased awareness of safety (I encouraged him to use his RW at home since he did so much better with it today than not using AD yesterday--he says there is no room to use one in his house)                       Pertinent Vitals/ Pain       Pain Assessment: No/denies pain         Frequency Min 2X/week     Progress Toward Goals  OT Goals(current goals can now be found in the care plan section)  Progress towards OT goals: Progressing toward goals     Plan Discharge plan needs to be updated       End of Session Equipment Utilized During Treatment: Gait belt;Rolling walker;Oxygen (5 liters)   Activity Tolerance Patient tolerated treatment well   Patient Left in chair;with call bell/phone within reach;with chair alarm set   Nurse Communication  (Pt wants respiratory to come and talk to him about the Cpap in his room)        Time: 1003-1030 OT Time  Calculation (min): 27 min  Charges: OT General Charges $OT Visit: 1 Procedure OT Treatments $Self Care/Home Management : 23-37 mins  Almon Register [id number] 11/03/2015, 10:58 AM

## 2015-11-09 NOTE — Discharge Summary (Signed)
Physician Discharge Summary  Joshua Conrad [id number] DOB: [birth year] DOA: 11/01/2015  PCP: Sandi Mariscal, MD  Admit date: 11/01/2015 Discharge date: 11/09/2015  Time spent: 40 minutes  Recommendations for Outpatient Follow-up:  1. Will need BMET and CBC in 1 week 2. recommend discussion of Mycophenolate use and Reglan use with [name] MD's  3. Patient has been cleared to d/c home 11/03/15 with [name] following 4. CPAP and Sleep study to be arranged as an OP  Discharge Diagnoses:  Principal Problem:   Acute delirium Active Problems:   Delirium   COPD (chronic obstructive pulmonary disease) (HCC)   CAD (coronary artery disease)   OSA (obstructive sleep apnea)   CHF (congestive heart failure) (HCC)   Chronic anemia   Discharge Condition: improved  Diet recommendation: HH low salt  Filed Weights   11/02/15 0254 11/03/15 0729  Weight: 97.297 kg (214 lb 8 oz) 91.8 kg (202 lb 6.1 oz)    History of present illness:  69 y/o ? 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 H  Admitted [facility] by his sister and brother in law for hallucinations which started yesterday. He has had vomiting recently and per his SIL he is often nauseated and has dry heaves. He has been on Reglan recently for his nausea and states it causes diarrhea. His SIL states he had diarrhea even before the Reglan. He also has OSA but his machine is broken and medicare will not pay for a new one yet.   Hospital Course:  Acute delirium - MRI brain negative - symptoms waxing and waning- no agitation- no new medications - no underlying infection - dementia?  Vs medication effect of Reglan and probable chr hypercapnia with not being on CPAP -his mental status is much improved and he is much more cognizant this am    COPD (chronic obstructive pulmonary disease)  - currently stable  Vomiting/ diarrhea - d/c  Reglan and start Zofran PRN- no GI issues today  Pancytopenia - check anemia panel and peripheral smear -anemia panel showed no anomalies -likely 2/2 to mycopehnolate use  Kaposi's Sarcoma - recently received radiation -OP follow up   Pulm fibrosis - cont Mycophenolate and O2 4 liters   CAD (coronary artery disease) -cont ASA   OSA (obstructive sleep apnea) - CPAP ordered -Sleep study to be done as OP   CHF (congestive heart failure) - undetermined if diastolic or systolic - no ECHO in Epic - cont Bumex and ARB  Discharge Exam: Filed Vitals:   11/03/15 0554 11/03/15 1144  BP: 128/72 119/58  Pulse: 75 74  Temp: 98.2 F (36.8 C) 98.2 F (36.8 C)  Resp: 18 18   Alert conversant oriented and in NAD General: alert pleasant Cardiovascular: s1 s2no m/r/g Respiratory: clear ant but crackles posteriorly  Discharge Instructions   Discharge Instructions    Diet - low sodium heart healthy    Complete by:  As directed      Discharge instructions    Complete by:  As directed   Continue your medications without change Please get lab work at your regular MD office soon and follow up as appropriate with your specialists Notify if any SOB or any other issues     Increase activity slowly    Complete by:  As directed           Discharge Medication List as of 11/03/2015  4:41 PM    CONTINUE these medications which have NOT CHANGED   Details  albuterol (PROVENTIL HFA;VENTOLIN HFA) 108 (90 BASE) MCG/ACT inhaler Inhale 2 puffs into the lungs every 6 (six) hours as needed for wheezing or shortness of breath., Until Discontinued, Historical Med    albuterol (PROVENTIL) (2.5 MG/3ML) 0.083% nebulizer solution Take 2.5 mg by nebulization every 4 (four) hours as needed for wheezing or shortness of breath., Until Discontinued, Historical Med    allopurinol (ZYLOPRIM) 300 MG tablet Take 300 mg by mouth daily., Until Discontinued, Historical Med    aspirin 81 MG tablet Take 81 mg  by mouth daily., Until Discontinued, Historical Med    bumetanide (BUMEX) 1 MG tablet Take 1 mg by mouth daily., Until Discontinued, Historical Med    cholecalciferol (VITAMIN D) 400 UNITS TABS tablet Take 400 Units by mouth daily., Until Discontinued, Historical Med    ferrous sulfate 325 (65 FE) MG tablet Take 325 mg by mouth 2 (two) times daily., Until Discontinued, Historical Med    fluticasone (FLONASE) 50 MCG/ACT nasal spray Place 2 sprays into both nostrils at bedtime., Until Discontinued, Historical Med    losartan (COZAAR) 50 MG tablet Take 50 mg by mouth daily., Until Discontinued, Historical Med    montelukast (SINGULAIR) 10 MG tablet Take 10 mg by mouth at bedtime., Until Discontinued, Historical Med    mycophenolate (CELLCEPT) 500 MG tablet Take 1,500 mg by mouth 2 (two) times daily., Until Discontinued, Historical Med    OXYGEN Inhale 4 L into the lungs continuous., Until Discontinued, Historical Med    potassium chloride (K-DUR) 10 MEQ tablet Take 10 mEq by mouth daily., Until Discontinued, Historical Med    ranitidine (ZANTAC) 150 MG tablet Take 150 mg by mouth 2 (two) times daily., Until Discontinued, Historical Med    simvastatin (ZOCOR) 5 MG tablet Take 5 mg by mouth at bedtime., Until Discontinued, Historical Med    vitamin C (ASCORBIC ACID) 500 MG tablet Take 500 mg by mouth daily., Until Discontinued, Historical Med      STOP taking these medications     metoCLOPramide (REGLAN) 10 MG tablet        Allergies  Allergen Reactions  . Prednisone     Caused pt to go crazy    Follow-up Information    Follow up with [facility] On 11/10/2015.   Specialty:  Sleep Medicine   Why:  at 8 pm for your sleep study test.   Contact information:   [place], [place] [id number] [place] [phone]       The results of significant diagnostics from this hospitalization (including imaging, microbiology,  ancillary and laboratory) are listed below for reference.    Significant Diagnostic Studies: Dg Chest 2 View  11/01/2015  CLINICAL DATA:  69 year old male with confusion and shortness of breath EXAM: CHEST  2 VIEW COMPARISON:  Prior chest x-ray 09/30/2015 FINDINGS: Very low inspiratory volumes with extensive predominantly basilar and peripheral interstitial opacities in a reticular pattern. Overall, there is very little change in the appearance of the lungs compared to prior imaging. Cardiac and the mediastinal contours are grossly unchanged and partially distorted due to chronic elevation of the left hemidiaphragm. No pneumothorax. The visualized bowel gas pattern is unremarkable. No acute osseous abnormality. IMPRESSION: Stable appearance of advanced pulmonary parenchymal changes most suggestive of advanced pulmonary fibrosis. Persistent chronic elevation of the left hemidiaphragm. Electronically Signed   By: Dellis Filbert.D.  On: 11/01/2015 18:52   Ct Head Wo Contrast  11/01/2015  CLINICAL DATA:  Initial evaluation for intermittent headaches, visual hallucinations. EXAM: CT HEAD WITHOUT CONTRAST TECHNIQUE: Contiguous axial images were obtained from the base of the skull through the vertex without intravenous contrast. COMPARISON:  None. FINDINGS: Diffuse prominence of the CSF containing spaces is consistent with generalized age-related cerebral atrophy. Patchy hypodensity within the periventricular deep white matter both cerebral hemispheres most consistent with chronic small vessel ischemic disease. Scattered vascular calcifications within the carotid siphons. No acute large vessel territory infarct. No acute intracranial hemorrhage. No mass lesion, midline shift, or mass effect. No hydrocephalus. No extra-axial fluid collection. Scalp soft tissues within normal limits. No acute abnormality about the orbits. Moderate mucosal thickening within the right maxillary sinus, chronic in nature. Opacity  present at the right sphenoid ethmoidal recess. Scattered mucosal thickening within the ethmoidal air cells. Mild opacity within the inferior left mastoid air cells. Calvarium intact. IMPRESSION: 1. No acute intracranial process. 2. Generalized age-related cerebral atrophy with moderate chronic small vessel ischemic disease. 3. Chronic right maxillary sinus disease. Electronically Signed   By: Jeannine Boga M.D.   On: 11/01/2015 20:45   Mr Brain Wo Contrast  11/02/2015  CLINICAL DATA:  Initial evaluation for acute confusion with hallucinations. EXAM: MRI HEAD WITHOUT CONTRAST TECHNIQUE: Multiplanar, multiecho pulse sequences of the brain and surrounding structures were obtained without intravenous contrast. COMPARISON:  Prior CT from 11/01/2015. FINDINGS: Age-related cerebral atrophy present. T2/FLAIR hyperintensity within the periventricular and deep white matter most likely related to chronic small vessel ischemic disease, mild nature. No areas of chronic infarction. No abnormal foci of restricted diffusion to suggest acute intracranial infarct. Gray-white matter differentiation maintained. Normal intravascular flow voids preserved. No acute or chronic intracranial hemorrhage. No mass lesion, midline shift, or mass effect. No hydrocephalus. No extra-axial fluid collection. Craniocervical junction within normal limits. Pituitary gland within normal limits. No acute abnormality about the orbits. Moderate mucosal thickening present within the right max O sinus. More mild mucosal thickening within the ethmoidal air cells and left maxillary sinus. Retention cyst within the right sphenoid sinus. A small left mastoid effusion noted. Trace opacity within inferior right mastoid air cells. Inner ear structures normal. Bone marrow signal intensity within normal limits. Scalp soft tissues demonstrate no acute abnormality. Probable small sebaceous cyst noted within the site days fat of the suboccipital region.  IMPRESSION: 1. No acute intracranial process. 2. Generalized age-related cerebral atrophy with mild chronic small vessel ischemic disease. 3. Moderate right maxillary sinus disease. 4. Small left mastoid effusion. Electronically Signed   By: Jeannine Boga M.D.   On: 11/02/2015 02:30    Microbiology: No results found for this or any previous visit (from the past 240 hour(s)).   Labs: Basic Metabolic Panel:  Recent Labs Lab 11/02/15 1858  NA 141  K 3.6  CL 93*  CO2 43*  GLUCOSE 128*  BUN 10  CREATININE 1.03  CALCIUM 9.2   Liver Function Tests: No results for input(s): AST, ALT, ALKPHOS, BILITOT, PROT, ALBUMIN in the last 168 hours. No results for input(s): LIPASE, AMYLASE in the last 168 hours. No results for input(s): AMMONIA in the last 168 hours. CBC:  Recent Labs Lab 11/03/15 0333  WBC 2.4*  HGB 8.9*  HCT 30.7*  MCV 99.7  PLT 109*   Cardiac Enzymes: No results for input(s): CKTOTAL, CKMB, CKMBINDEX, TROPONINI in the last 168 hours. BNP: BNP (last 3 results) No results for input(s): BNP in the  last 8760 hours.  ProBNP (last 3 results) No results for input(s): PROBNP in the last 8760 hours.  CBG: No results for input(s): GLUCAP in the last 168 hours.     SignedNita Sells  [facility] 11/09/2015, 5:59 PM

## 2015-11-10 ENCOUNTER — Ambulatory Visit (HOSPITAL_BASED_OUTPATIENT_CLINIC_OR_DEPARTMENT_OTHER): Payer: Medicare Other

## 2016-03-21 ENCOUNTER — Encounter (HOSPITAL_COMMUNITY): Payer: Self-pay | Admitting: Emergency Medicine

## 2016-03-21 ENCOUNTER — Emergency Department (HOSPITAL_COMMUNITY): Payer: Medicare Other

## 2016-03-21 ENCOUNTER — Emergency Department (HOSPITAL_COMMUNITY)
Admission: EM | Admit: 2016-03-21 | Discharge: 2016-03-22 | Disposition: A | Discharge status: Home / Self Care | Payer: Medicare Other | Attending: Emergency Medicine | Admitting: Emergency Medicine

## 2016-03-21 DIAGNOSIS — E785 Hyperlipidemia, unspecified: Secondary | ICD-10-CM | POA: Insufficient documentation

## 2016-03-21 DIAGNOSIS — Z7951 Long term (current) use of inhaled steroids: Secondary | ICD-10-CM | POA: Diagnosis not present

## 2016-03-21 DIAGNOSIS — Z79899 Other long term (current) drug therapy: Secondary | ICD-10-CM | POA: Diagnosis not present

## 2016-03-21 DIAGNOSIS — J9611 Chronic respiratory failure with hypoxia: Secondary | ICD-10-CM | POA: Diagnosis not present

## 2016-03-21 DIAGNOSIS — I509 Heart failure, unspecified: Secondary | ICD-10-CM | POA: Diagnosis not present

## 2016-03-21 DIAGNOSIS — Z7982 Long term (current) use of aspirin: Secondary | ICD-10-CM | POA: Diagnosis not present

## 2016-03-21 DIAGNOSIS — I2510 Atherosclerotic heart disease of native coronary artery without angina pectoris: Secondary | ICD-10-CM | POA: Insufficient documentation

## 2016-03-21 DIAGNOSIS — J441 Chronic obstructive pulmonary disease with (acute) exacerbation: Secondary | ICD-10-CM | POA: Diagnosis not present

## 2016-03-21 DIAGNOSIS — I10 Essential (primary) hypertension: Secondary | ICD-10-CM | POA: Diagnosis not present

## 2016-03-21 DIAGNOSIS — R0602 Shortness of breath: Secondary | ICD-10-CM | POA: Diagnosis present

## 2016-03-21 DIAGNOSIS — R600 Localized edema: Secondary | ICD-10-CM | POA: Diagnosis not present

## 2016-03-21 DIAGNOSIS — Z9861 Coronary angioplasty status: Secondary | ICD-10-CM | POA: Diagnosis not present

## 2016-03-21 LAB — CBC WITH DIFFERENTIAL/PLATELET
Basophils Absolute: 0 10*3/uL (ref 0.0–0.1)
Basophils Relative: 0 %
EOS PCT: 2 %
Eosinophils Absolute: 0 10*3/uL (ref 0.0–0.7)
HEMATOCRIT: 33.5 % — AB (ref 39.0–52.0)
Hemoglobin: 10.5 g/dL — ABNORMAL LOW (ref 13.0–17.0)
LYMPHS ABS: 0.7 10*3/uL (ref 0.7–4.0)
LYMPHS PCT: 27 %
MCH: 28.8 pg (ref 26.0–34.0)
MCHC: 31.3 g/dL (ref 30.0–36.0)
MCV: 91.8 fL (ref 78.0–100.0)
MONO ABS: 0.2 10*3/uL (ref 0.1–1.0)
MONOS PCT: 9 %
Neutro Abs: 1.6 10*3/uL — ABNORMAL LOW (ref 1.7–7.7)
Neutrophils Relative %: 62 %
Platelets: 127 10*3/uL — ABNORMAL LOW (ref 150–400)
RBC: 3.65 MIL/uL — ABNORMAL LOW (ref 4.22–5.81)
RDW: 14.1 % (ref 11.5–15.5)
WBC: 2.6 10*3/uL — ABNORMAL LOW (ref 4.0–10.5)

## 2016-03-21 LAB — COMPREHENSIVE METABOLIC PANEL
ALBUMIN: 3.5 g/dL (ref 3.5–5.0)
ALT: 10 U/L — ABNORMAL LOW (ref 17–63)
AST: 18 U/L (ref 15–41)
Alkaline Phosphatase: 64 U/L (ref 38–126)
Anion gap: 10 (ref 5–15)
BILIRUBIN TOTAL: 0.7 mg/dL (ref 0.3–1.2)
BUN: 16 mg/dL (ref 6–20)
CHLORIDE: 92 mmol/L — AB (ref 101–111)
CO2: 40 mmol/L — AB (ref 22–32)
Calcium: 9.8 mg/dL (ref 8.9–10.3)
Creatinine, Ser: 1.03 mg/dL (ref 0.61–1.24)
GFR calc Af Amer: >60 mL/min (ref >60)
GFR calc non Af Amer: >60 mL/min (ref >60)
GLUCOSE: 109 mg/dL — AB (ref 65–99)
POTASSIUM: 3.4 mmol/L — AB (ref 3.5–5.1)
SODIUM: 142 mmol/L (ref 135–145)
Total Protein: 6.1 g/dL — ABNORMAL LOW (ref 6.5–8.1)

## 2016-03-21 LAB — URINALYSIS, ROUTINE W REFLEX MICROSCOPIC
Bilirubin Urine: NEGATIVE
GLUCOSE, UA: NEGATIVE mg/dL
Hgb urine dipstick: NEGATIVE
KETONES UR: NEGATIVE mg/dL
LEUKOCYTES UA: NEGATIVE
Nitrite: NEGATIVE
PH: 7 (ref 5.0–8.0)
Protein, ur: 30 mg/dL — AB
Specific Gravity, Urine: 1.019 (ref 1.005–1.030)

## 2016-03-21 LAB — URINE MICROSCOPIC-ADD ON

## 2016-03-21 LAB — I-STAT TROPONIN, ED: Troponin i, poc: 0.02 ng/mL (ref 0.00–0.08)

## 2016-03-21 LAB — BRAIN NATRIURETIC PEPTIDE: B Natriuretic Peptide: 57.5 pg/mL (ref 0.0–100.0)

## 2016-03-21 NOTE — ED Notes (Signed)
Pt family stated that they did not want him to return to [place] due to him not responding lucidly to baseline.
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
## 2016-03-21 NOTE — ED Notes (Signed)
PA notified that pt family wanted to speak with them.

## 2016-03-21 NOTE — ED Notes (Signed)
Bed: WA07 Expected date:  Expected time:  Means of arrival:  Comments: EMS- 70yo M, BLE edema/hypoxia

## 2016-03-21 NOTE — ED Notes (Signed)
Transportation called for pt to be discharged back to [place].

## 2016-03-21 NOTE — ED Notes (Signed)
Pt requesting to have urinalysis due to having strong smelling urine for the last several days.

## 2016-03-21 NOTE — Discharge Instructions (Signed)
Mr. Stake was seen in the emergency room today for evaluation of shortness of breath. His workup was completely unremarkable. We will call him if his urine culture grows out anything significant. His symptoms were likely due to missed medications yesterday and today, as well has his home oxygen nasal cannula falling off. Please have him resume his home medications. Please also have him follow up with his primary care provider within one week. Return to the ER for new or worsening symptoms.

## 2016-03-21 NOTE — ED Notes (Addendum)
Pt comes from [place] today. Staff called out saying that pt was confused. Nasal cannula was not in nose. 83% RA. Has not had meds x 2 days. Bilateral lower leg edema. Pt states it is worse than normal. Alert with intermittent confusion. 10mg  albuterol .5mg  atrovent given en route.

## 2016-03-22 NOTE — ED Notes (Signed)
[facility] here for transport. 

## 2016-03-22 NOTE — ED Notes (Signed)
Pt care facility reports understanding of discharge information. No questions at time of discharge

## 2016-03-23 LAB — URINE CULTURE

## 2016-05-14 ENCOUNTER — Encounter (HOSPITAL_COMMUNITY): Payer: Self-pay | Admitting: Emergency Medicine

## 2016-05-14 ENCOUNTER — Emergency Department (HOSPITAL_COMMUNITY): Payer: Medicare Other

## 2016-05-14 ENCOUNTER — Inpatient Hospital Stay (HOSPITAL_COMMUNITY)
Admission: EM | Admit: 2016-05-14 | Discharge: 2016-05-18 | DRG: 291 | Disposition: A | Discharge status: Skilled Nursing Facility | Payer: Medicare Other | Attending: Internal Medicine | Admitting: Internal Medicine

## 2016-05-14 DIAGNOSIS — I5031 Acute diastolic (congestive) heart failure: Principal | ICD-10-CM | POA: Diagnosis present

## 2016-05-14 DIAGNOSIS — Z7951 Long term (current) use of inhaled steroids: Secondary | ICD-10-CM

## 2016-05-14 DIAGNOSIS — J9622 Acute and chronic respiratory failure with hypercapnia: Secondary | ICD-10-CM | POA: Diagnosis present

## 2016-05-14 DIAGNOSIS — Z9981 Dependence on supplemental oxygen: Secondary | ICD-10-CM | POA: Diagnosis not present

## 2016-05-14 DIAGNOSIS — D6489 Other specified anemias: Secondary | ICD-10-CM | POA: Diagnosis present

## 2016-05-14 DIAGNOSIS — Z66 Do not resuscitate: Secondary | ICD-10-CM | POA: Diagnosis present

## 2016-05-14 DIAGNOSIS — E785 Hyperlipidemia, unspecified: Secondary | ICD-10-CM | POA: Diagnosis present

## 2016-05-14 DIAGNOSIS — I2510 Atherosclerotic heart disease of native coronary artery without angina pectoris: Secondary | ICD-10-CM | POA: Diagnosis present

## 2016-05-14 DIAGNOSIS — G9341 Metabolic encephalopathy: Secondary | ICD-10-CM | POA: Diagnosis present

## 2016-05-14 DIAGNOSIS — D638 Anemia in other chronic diseases classified elsewhere: Secondary | ICD-10-CM | POA: Diagnosis present

## 2016-05-14 DIAGNOSIS — E43 Unspecified severe protein-calorie malnutrition: Secondary | ICD-10-CM | POA: Diagnosis present

## 2016-05-14 DIAGNOSIS — R000 Tachycardia, unspecified: Secondary | ICD-10-CM | POA: Diagnosis present

## 2016-05-14 DIAGNOSIS — I130 Hypertensive heart and chronic kidney disease with heart failure and stage 1 through stage 4 chronic kidney disease, or unspecified chronic kidney disease: Secondary | ICD-10-CM | POA: Diagnosis present

## 2016-05-14 DIAGNOSIS — R4182 Altered mental status, unspecified: Secondary | ICD-10-CM | POA: Diagnosis present

## 2016-05-14 DIAGNOSIS — J9621 Acute and chronic respiratory failure with hypoxia: Secondary | ICD-10-CM | POA: Diagnosis present

## 2016-05-14 DIAGNOSIS — I5032 Chronic diastolic (congestive) heart failure: Secondary | ICD-10-CM

## 2016-05-14 DIAGNOSIS — R531 Weakness: Secondary | ICD-10-CM | POA: Diagnosis not present

## 2016-05-14 DIAGNOSIS — Z888 Allergy status to other drugs, medicaments and biological substances status: Secondary | ICD-10-CM

## 2016-05-14 DIAGNOSIS — J8410 Pulmonary fibrosis, unspecified: Secondary | ICD-10-CM | POA: Diagnosis present

## 2016-05-14 DIAGNOSIS — N182 Chronic kidney disease, stage 2 (mild): Secondary | ICD-10-CM | POA: Diagnosis present

## 2016-05-14 DIAGNOSIS — R410 Disorientation, unspecified: Secondary | ICD-10-CM | POA: Diagnosis not present

## 2016-05-14 DIAGNOSIS — J984 Other disorders of lung: Secondary | ICD-10-CM | POA: Diagnosis present

## 2016-05-14 DIAGNOSIS — I509 Heart failure, unspecified: Secondary | ICD-10-CM | POA: Diagnosis not present

## 2016-05-14 DIAGNOSIS — Z955 Presence of coronary angioplasty implant and graft: Secondary | ICD-10-CM

## 2016-05-14 DIAGNOSIS — I2781 Cor pulmonale (chronic): Secondary | ICD-10-CM | POA: Diagnosis present

## 2016-05-14 DIAGNOSIS — J986 Disorders of diaphragm: Secondary | ICD-10-CM | POA: Diagnosis present

## 2016-05-14 DIAGNOSIS — G9340 Encephalopathy, unspecified: Secondary | ICD-10-CM | POA: Diagnosis not present

## 2016-05-14 DIAGNOSIS — E872 Acidosis: Secondary | ICD-10-CM | POA: Diagnosis present

## 2016-05-14 DIAGNOSIS — E8729 Other acidosis: Secondary | ICD-10-CM

## 2016-05-14 DIAGNOSIS — G4733 Obstructive sleep apnea (adult) (pediatric): Secondary | ICD-10-CM | POA: Diagnosis present

## 2016-05-14 DIAGNOSIS — I340 Nonrheumatic mitral (valve) insufficiency: Secondary | ICD-10-CM | POA: Diagnosis present

## 2016-05-14 DIAGNOSIS — Z8249 Family history of ischemic heart disease and other diseases of the circulatory system: Secondary | ICD-10-CM

## 2016-05-14 DIAGNOSIS — J449 Chronic obstructive pulmonary disease, unspecified: Secondary | ICD-10-CM | POA: Diagnosis present

## 2016-05-14 DIAGNOSIS — Z87891 Personal history of nicotine dependence: Secondary | ICD-10-CM | POA: Diagnosis not present

## 2016-05-14 DIAGNOSIS — Z8589 Personal history of malignant neoplasm of other organs and systems: Secondary | ICD-10-CM | POA: Diagnosis not present

## 2016-05-14 DIAGNOSIS — Z79899 Other long term (current) drug therapy: Secondary | ICD-10-CM

## 2016-05-14 DIAGNOSIS — J9690 Respiratory failure, unspecified, unspecified whether with hypoxia or hypercapnia: Secondary | ICD-10-CM

## 2016-05-14 DIAGNOSIS — J9620 Acute and chronic respiratory failure, unspecified whether with hypoxia or hypercapnia: Secondary | ICD-10-CM | POA: Diagnosis present

## 2016-05-14 DIAGNOSIS — Z923 Personal history of irradiation: Secondary | ICD-10-CM | POA: Diagnosis not present

## 2016-05-14 DIAGNOSIS — M109 Gout, unspecified: Secondary | ICD-10-CM | POA: Diagnosis present

## 2016-05-14 HISTORY — DX: Obstructive sleep apnea (adult) (pediatric): G47.33

## 2016-05-14 HISTORY — DX: Disorders of diaphragm: J98.6

## 2016-05-14 HISTORY — DX: Kaposi's sarcoma, unspecified: C46.9

## 2016-05-14 LAB — CBC
HEMATOCRIT: 31.9 % — AB (ref 39.0–52.0)
HEMOGLOBIN: 9.9 g/dL — AB (ref 13.0–17.0)
MCH: 28.2 pg (ref 26.0–34.0)
MCHC: 31 g/dL (ref 30.0–36.0)
MCV: 90.9 fL (ref 78.0–100.0)
Platelets: 171 10*3/uL (ref 150–400)
RBC: 3.51 MIL/uL — AB (ref 4.22–5.81)
RDW: 14.6 % (ref 11.5–15.5)
WBC: 4 10*3/uL (ref 4.0–10.5)

## 2016-05-14 LAB — BLOOD GAS, ARTERIAL
Acid-Base Excess: 14.9 mmol/L — ABNORMAL HIGH (ref 0.0–2.0)
BICARBONATE: 42.9 meq/L — AB (ref 20.0–24.0)
DRAWN BY: 308601
Delivery systems: POSITIVE
Drawn by: 331471
FIO2: 0.4
MODE: POSITIVE
O2 Content: 5 L/min
O2 SAT: 94.4 %
O2 Saturation: 98.1 %
PATIENT TEMPERATURE: 98.6
PATIENT TEMPERATURE: 98.6
PCO2 ART: 84 mmHg — AB (ref 35.0–45.0)
PEEP: 5 cmH2O
PH ART: 7.213 — AB (ref 7.350–7.450)
PH ART: 7.329 — AB (ref 7.350–7.450)
PO2 ART: 123 mmHg — AB (ref 80.0–100.0)
PO2 ART: 72.5 mmHg — AB (ref 80.0–100.0)
Pressure control: 15 cmH2O
TCO2: 41.1 mmol/L (ref 0–100)

## 2016-05-14 LAB — COMPREHENSIVE METABOLIC PANEL
ALBUMIN: 4.1 g/dL (ref 3.5–5.0)
ALK PHOS: 68 U/L (ref 38–126)
ALT: 11 U/L — AB (ref 17–63)
AST: 21 U/L (ref 15–41)
Anion gap: 9 (ref 5–15)
BILIRUBIN TOTAL: 0.9 mg/dL (ref 0.3–1.2)
BUN: 25 mg/dL — AB (ref 6–20)
CO2: 42 mmol/L — ABNORMAL HIGH (ref 22–32)
CREATININE: 1.27 mg/dL — AB (ref 0.61–1.24)
Calcium: 10.2 mg/dL (ref 8.9–10.3)
Chloride: 91 mmol/L — ABNORMAL LOW (ref 101–111)
GFR calc Af Amer: >60 mL/min (ref >60)
GFR, EST NON AFRICAN AMERICAN: 56 mL/min — AB (ref >60)
GLUCOSE: 104 mg/dL — AB (ref 65–99)
Potassium: 4.2 mmol/L (ref 3.5–5.1)
Sodium: 142 mmol/L (ref 135–145)
TOTAL PROTEIN: 6.8 g/dL (ref 6.5–8.1)

## 2016-05-14 LAB — MRSA PCR SCREENING: MRSA by PCR: NEGATIVE

## 2016-05-14 LAB — URINALYSIS, ROUTINE W REFLEX MICROSCOPIC
BILIRUBIN URINE: NEGATIVE
GLUCOSE, UA: NEGATIVE mg/dL
HGB URINE DIPSTICK: NEGATIVE
Ketones, ur: NEGATIVE mg/dL
Leukocytes, UA: NEGATIVE
Nitrite: NEGATIVE
PROTEIN: NEGATIVE mg/dL
Specific Gravity, Urine: 1.014 (ref 1.005–1.030)
pH: 6 (ref 5.0–8.0)

## 2016-05-14 LAB — BRAIN NATRIURETIC PEPTIDE: B Natriuretic Peptide: 26.6 pg/mL (ref 0.0–100.0)

## 2016-05-14 LAB — TROPONIN I: TROPONIN I: 0.03 ng/mL (ref <0.031)

## 2016-05-14 MED ORDER — CHLORHEXIDINE GLUCONATE 0.12 % MT SOLN
15.0000 mL | Freq: Two times a day (BID) | OROMUCOSAL | Status: DC
  Administered 2016-05-14 – 2016-05-18 (×8): 15 mL via OROMUCOSAL
  Filled 2016-05-14 (×8): qty 15

## 2016-05-14 MED ORDER — ALBUTEROL SULFATE (2.5 MG/3ML) 0.083% IN NEBU: 5.0000 mg | INHALATION_SOLUTION | Freq: Once | RESPIRATORY_TRACT | Status: DC

## 2016-05-14 MED ORDER — ALLOPURINOL 300 MG PO TABS
300.0000 mg | ORAL_TABLET | Freq: Every day | ORAL | Status: DC
  Administered 2016-05-14 – 2016-05-18 (×5): 300 mg via ORAL
  Filled 2016-05-14 (×3): qty 3
  Filled 2016-05-14 (×2): qty 1

## 2016-05-14 MED ORDER — ALBUTEROL SULFATE (2.5 MG/3ML) 0.083% IN NEBU: 5.0000 mg | INHALATION_SOLUTION | RESPIRATORY_TRACT | Status: DC | PRN

## 2016-05-14 MED ORDER — LORAZEPAM 2 MG/ML IJ SOLN
0.5000 mg | Freq: Once | INTRAMUSCULAR | Status: AC
  Administered 2016-05-14: 0.5 mg via INTRAVENOUS
  Filled 2016-05-14: qty 1

## 2016-05-14 MED ORDER — IPRATROPIUM-ALBUTEROL 0.5-2.5 (3) MG/3ML IN SOLN
3.0000 mL | RESPIRATORY_TRACT | Status: DC | PRN
  Administered 2016-05-16 – 2016-05-17 (×3): 3 mL via RESPIRATORY_TRACT
  Filled 2016-05-14 (×2): qty 3

## 2016-05-14 MED ORDER — FAMOTIDINE 20 MG PO TABS
20.0000 mg | ORAL_TABLET | Freq: Every day | ORAL | Status: DC
  Administered 2016-05-14 – 2016-05-17 (×4): 20 mg via ORAL
  Filled 2016-05-14 (×4): qty 1

## 2016-05-14 MED ORDER — SODIUM CHLORIDE 0.9 % IV SOLN
INTRAVENOUS | Status: DC
  Administered 2016-05-14: 16:00:00 via INTRAVENOUS

## 2016-05-14 MED ORDER — SIMVASTATIN 5 MG PO TABS
5.0000 mg | ORAL_TABLET | Freq: Every day | ORAL | Status: DC
  Administered 2016-05-14 – 2016-05-17 (×4): 5 mg via ORAL
  Filled 2016-05-14 (×5): qty 1

## 2016-05-14 MED ORDER — IPRATROPIUM BROMIDE 0.02 % IN SOLN: 0.5000 mg | Freq: Once | RESPIRATORY_TRACT | Status: DC

## 2016-05-14 MED ORDER — CHOLECALCIFEROL 10 MCG (400 UNIT) PO TABS
400.0000 [IU] | ORAL_TABLET | Freq: Every day | ORAL | Status: DC
  Administered 2016-05-14 – 2016-05-17 (×4): 400 [IU] via ORAL
  Filled 2016-05-14 (×6): qty 1

## 2016-05-14 MED ORDER — IPRATROPIUM BROMIDE 0.02 % IN SOLN
0.5000 mg | Freq: Once | RESPIRATORY_TRACT | Status: AC
  Administered 2016-05-14: 0.5 mg via RESPIRATORY_TRACT
  Filled 2016-05-14: qty 2.5

## 2016-05-14 MED ORDER — VITAMIN C 500 MG PO TABS
500.0000 mg | ORAL_TABLET | Freq: Every day | ORAL | Status: DC
  Administered 2016-05-14 – 2016-05-18 (×5): 500 mg via ORAL
  Filled 2016-05-14 (×5): qty 1

## 2016-05-14 MED ORDER — FERROUS SULFATE 325 (65 FE) MG PO TABS
325.0000 mg | ORAL_TABLET | Freq: Two times a day (BID) | ORAL | Status: DC
Start: 2016-05-14 — End: 2016-05-18
  Administered 2016-05-14 – 2016-05-18 (×8): 325 mg via ORAL
  Filled 2016-05-14 (×8): qty 1

## 2016-05-14 MED ORDER — ENOXAPARIN SODIUM 40 MG/0.4ML ~~LOC~~ SOLN
40.0000 mg | SUBCUTANEOUS | Status: DC
  Administered 2016-05-14 – 2016-05-17 (×4): 40 mg via SUBCUTANEOUS
  Filled 2016-05-14 (×4): qty 0.4

## 2016-05-14 MED ORDER — MONTELUKAST SODIUM 10 MG PO TABS
10.0000 mg | ORAL_TABLET | Freq: Every day | ORAL | Status: DC
  Administered 2016-05-14 – 2016-05-17 (×4): 10 mg via ORAL
  Filled 2016-05-14 (×4): qty 1

## 2016-05-14 MED ORDER — ALBUTEROL SULFATE (2.5 MG/3ML) 0.083% IN NEBU
5.0000 mg | INHALATION_SOLUTION | Freq: Once | RESPIRATORY_TRACT | Status: AC
  Administered 2016-05-14: 5 mg via RESPIRATORY_TRACT
  Filled 2016-05-14: qty 6

## 2016-05-14 MED ORDER — FLUTICASONE PROPIONATE 50 MCG/ACT NA SUSP
2.0000 | Freq: Every day | NASAL | Status: DC
  Administered 2016-05-14 – 2016-05-17 (×3): 2 via NASAL
  Filled 2016-05-14: qty 16

## 2016-05-14 MED ORDER — FUROSEMIDE 10 MG/ML IJ SOLN
40.0000 mg | Freq: Four times a day (QID) | INTRAMUSCULAR | Status: AC
  Administered 2016-05-14 (×2): 40 mg via INTRAVENOUS
  Filled 2016-05-14 (×2): qty 4

## 2016-05-14 MED ORDER — CETYLPYRIDINIUM CHLORIDE 0.05 % MT LIQD
7.0000 mL | Freq: Two times a day (BID) | OROMUCOSAL | Status: DC
  Administered 2016-05-14 – 2016-05-17 (×3): 7 mL via OROMUCOSAL

## 2016-05-14 NOTE — Progress Notes (Signed)
ANTICOAGULATION CONSULT NOTE - Initial Consult  Pharmacy Consult for Enoxaparin Indication: VTE prophylaxis  Allergies  Allergen Reactions  . Prednisone     Caused pt to go crazy     Patient Measurements: Height: 6\' 2"  (188 cm) Weight: 192 lb 3.9 oz (87.2 kg) IBW/kg (Calculated) : 82.2   Vital Signs: Temp: 97.7 F (36.5 C) (06/04 1435) Temp Source: Axillary (06/04 1435) BP: 111/81 mmHg (06/04 1435) Pulse Rate: 104 (06/04 1435)  Labs:  Recent Labs  05/14/16 1132  HGB 9.9*  HCT 31.9*  PLT 171  CREATININE 1.27*  TROPONINI 0.03    Estimated Creatinine Clearance: 63.8 mL/min (by C-G formula based on Cr of 1.27).   Medical History: Past Medical History  Diagnosis Date  . Pulmonary fibrosis ([place])   . MI (mitral incompetence)   . Hypertension   . Hyperlipidemia   . Coronary artery disease   . CHF (congestive heart failure) ([place])   . COPD (chronic obstructive pulmonary disease) ([place])   . Kaposi sarcoma ([place])   . OSA (obstructive sleep apnea)   . Diaphragm paralysis     Left hemidiaphragm    Assessment: 25 y/oM with PMH of pulmonary fibrosis, COPD, L diaphragm paralysis, CHF, Kaposi sarcoma, HTN, HLD, CAD, OSA who is admitted to [facility] on 05/14/16 with acute encephalopathy from acute on chronic hypoxic/hypercapnic respiratory failure. Pharmacy consulted to assist with dosing of Enoxaparin for VTE prophylaxis.    Patient not on any anticoagulants PTA  SCr 1.27 with CrCl ~ 64 ml/min CG  CBC: Hgb 9.9 (low, but stable compared to previous values), Pltc WNL  No bleeding issues reported  BMI < 30  Goal of Therapy:  Prevention of VTE  Monitor platelets by anticoagulation protocol: Yes   Plan:   Enoxaparin 40mg  SQ q24h.  CBC and SCr at least q7days while on Enoxaparin prophylaxis therapy.  Do not anticipate further dose adjustment, so pharmacy will sign off at this time. Please reconsult if a change in clinical status warrants re-evaluation of dosage.  Thank  you for allowing pharmacy to be a part of this patient's care.  Lindell Spar, PharmD, BCPS Pager: [phone] 05/14/2016 2:53 PM

## 2016-05-14 NOTE — ED Notes (Addendum)
Per EMS patient comes from [facility] where he lives in independent living part.Staff checks on patient 2 times per day.  Patient had fall sometime during night/morning.  Patient lost balance and fell back on walker, denies LOC.  Patient denies pain.  Patient states that he hasn't been eating or taking medications.  Patient states having generalized weakness over year.  Patient on 6L n/c continuous.  CBG 112

## 2016-05-14 NOTE — ED Provider Notes (Addendum)
CSN: [id number]     Arrival date & time 05/14/16  1004 History   First MD Initiated Contact with Patient 05/14/16 1023     Chief Complaint  Patient presents with  . Fall  . generalized weakness x year      (Consider location/radiation/quality/duration/timing/severity/associated sxs/prior Treatment) Patient is a 70 y.o. male presenting with fall. The history is provided by the patient, the EMS personnel and a relative. The history is limited by the condition of the patient.  Fall Associated symptoms include shortness of breath. Pertinent negatives include no chest pain, no abdominal pain and no headaches.  Patient from [facility], hx copd, home o2 6 liters, w fall, and generalized weakness x 1 year.  Patient poor/difficult historian - at times answer questions appropriately, at other times appears very disoriented/confused.   Patient states he is unsteady on feet at baseline, no recent change. Denies pain or injury. Denies loc.  Patient denies any new or acute numbness or weakness, but getting progressively worse . No headache. Denies neck or back pain. No chest pain. States always sob, home o2, states during prior admission o2 was increased from 4 liters to 6 liters. . Occasional non prod cough. No fever or chills. No abd pain. No vomiting or diarrhea. No gu c/o.   Family arrives, they state patient confused, which he has been intermittently in past, but worse now.       Past Medical History  Diagnosis Date  . Pulmonary fibrosis ([place])   . MI (mitral incompetence)   . Hypertension   . Hyperlipidemia   . Coronary artery disease   . CHF (congestive heart failure) ([place])   . COPD (chronic obstructive pulmonary disease) [facility])    Past Surgical History  Procedure Laterality Date  . Coronary angioplasty with stent placement    . Cataract extraction     Family History  Problem Relation Age of Onset  . Hypertension Mother   . Hypertension Father    Social History  Substance Use Topics  .  Smoking status: Former Research scientist (life sciences)  . Smokeless tobacco: None  . Alcohol Use: No     Comment: Quit 8 years ago.    Review of Systems  Constitutional: Negative for fever.  HENT: Negative for sore throat.   Eyes: Negative for redness.  Respiratory: Positive for shortness of breath.   Cardiovascular: Negative for chest pain.  Gastrointestinal: Negative for vomiting, abdominal pain and diarrhea.  Genitourinary: Negative for dysuria and flank pain.  Musculoskeletal: Negative for back pain and neck pain.  Skin: Negative for rash and wound.  Neurological: Negative for headaches.  Hematological: Does not bruise/bleed easily.  Psychiatric/Behavioral: Positive for confusion.      Allergies  Prednisone  Home Medications   Prior to Admission medications   Medication Sig Start Date End Date Taking? Authorizing Provider  albuterol (PROVENTIL HFA;VENTOLIN HFA) 108 (90 BASE) MCG/ACT inhaler Inhale 2 puffs into the lungs every 6 (six) hours as needed for wheezing or shortness of breath.    Historical Provider, MD  albuterol (PROVENTIL) (2.5 MG/3ML) 0.083% nebulizer solution Take 2.5 mg by nebulization every 4 (four) hours as needed for wheezing or shortness of breath.    Historical Provider, MD  allopurinol (ZYLOPRIM) 300 MG tablet Take 300 mg by mouth daily.    Historical Provider, MD  bumetanide (BUMEX) 1 MG tablet Take 1 mg by mouth daily.    Historical Provider, MD  cholecalciferol (VITAMIN D) 400 UNITS TABS tablet Take 400 Units by mouth daily.  Historical Provider, MD  ferrous sulfate 325 (65 FE) MG tablet Take 325 mg by mouth 2 (two) times daily.    Historical Provider, MD  fluticasone (FLONASE) 50 MCG/ACT nasal spray Place 2 sprays into both nostrils at bedtime.    Historical Provider, MD  losartan (COZAAR) 50 MG tablet Take 50 mg by mouth daily.    Historical Provider, MD  montelukast (SINGULAIR) 10 MG tablet Take 10 mg by mouth at bedtime.    Historical Provider, MD  mycophenolate  (CELLCEPT) 500 MG tablet Take 1,500 mg by mouth 2 (two) times daily.    Historical Provider, MD  OXYGEN Inhale 4 L into the lungs continuous.    Historical Provider, MD  potassium chloride (K-DUR) 10 MEQ tablet Take 10 mEq by mouth daily.    Historical Provider, MD  ranitidine (ZANTAC) 150 MG tablet Take 150 mg by mouth 2 (two) times daily.    Historical Provider, MD  simvastatin (ZOCOR) 5 MG tablet Take 5 mg by mouth at bedtime.    Historical Provider, MD  vitamin C (ASCORBIC ACID) 500 MG tablet Take 500 mg by mouth daily.    Historical Provider, MD   BP 161/84 mmHg  Pulse 106  Temp(Src) 97.6 F (36.4 C) (Oral)  Resp 20  SpO2 98% Physical Exam  Constitutional: He appears well-developed and well-nourished. No distress.  HENT:  Head: Atraumatic.  Mouth/Throat: Oropharynx is clear and moist.  No sts or tenderness.   Eyes: Conjunctivae are normal. Pupils are equal, round, and reactive to light.  Neck: Neck supple. No tracheal deviation present.  Cardiovascular: Normal rate, regular rhythm, normal heart sounds and intact distal pulses.  Exam reveals no gallop and no friction rub.   No murmur heard. Pulmonary/Chest: Effort normal. No accessory muscle usage. No respiratory distress.  Decreased bs bil.   Abdominal: Soft. Bowel sounds are normal. He exhibits no distension. There is no tenderness.  Genitourinary:  No cva tenderness.   Musculoskeletal: He exhibits edema.  Chronic bil lower leg edema. CTLS spine, non tender, aligned, no step off. Good rom bil ext without pain or focal bony tenderness  Neurological: He is alert.  Speech clear/fluent. Motor intact bil.   Skin: Skin is warm and dry. He is not diaphoretic.  Psychiatric:  Alert, but confused at times.   Nursing note and vitals reviewed.   ED Course  Procedures (including critical care time) Labs Review   Results for orders placed or performed during the hospital encounter of 05/14/16  CBC  Result Value Ref Range   WBC  4.0 4.0 - 10.5 K/uL   RBC 3.51 (L) 4.22 - 5.81 MIL/uL   Hemoglobin 9.9 (L) 13.0 - 17.0 g/dL   HCT 31.9 (L) 39.0 - 52.0 %   MCV 90.9 78.0 - 100.0 fL   MCH 28.2 26.0 - 34.0 pg   MCHC 31.0 30.0 - 36.0 g/dL   RDW 14.6 11.5 - 15.5 %   Platelets 171 150 - 400 K/uL  Comprehensive metabolic panel  Result Value Ref Range   Sodium 142 135 - 145 mmol/L   Potassium 4.2 3.5 - 5.1 mmol/L   Chloride 91 (L) 101 - 111 mmol/L   CO2 42 (H) 22 - 32 mmol/L   Glucose, Bld 104 (H) 65 - 99 mg/dL   BUN 25 (H) 6 - 20 mg/dL   Creatinine, Ser 1.27 (H) 0.61 - 1.24 mg/dL   Calcium 10.2 8.9 - 10.3 mg/dL   Total Protein 6.8 6.5 - 8.1 g/dL  Albumin 4.1 3.5 - 5.0 g/dL   AST 21 15 - 41 U/L   ALT 11 (L) 17 - 63 U/L   Alkaline Phosphatase 68 38 - 126 U/L   Total Bilirubin 0.9 0.3 - 1.2 mg/dL   GFR calc non Af Amer 56 (L) >60 mL/min   GFR calc Af Amer >60 >60 mL/min   Anion gap 9 5 - 15  Troponin I  Result Value Ref Range   Troponin I 0.03 <0.031 ng/mL  Brain natriuretic peptide  Result Value Ref Range   B Natriuretic Peptide 26.6 0.0 - 100.0 pg/mL  Blood gas, arterial  Result Value Ref Range   O2 Content 5.0 L/min   Delivery systems NASAL CANNULA    pH, Arterial 7.213 (L) 7.350 - 7.450   pCO2 arterial ABOVE REPORTABLE RANRANGE 35.0 - 45.0 mmHg   pO2, Arterial 123 (H) 80.0 - 100.0 mmHg   O2 Saturation 98.1 %   Patient temperature 98.6    Collection site RIGHT RADIAL    Drawn by [id number]    Sample type ARTERIAL DRAW    Allens test (pass/fail) PASS PASS   Dg Chest Port 1 View  05/14/2016  CLINICAL DATA:  Fall during the night.  COPD. EXAM: PORTABLE CHEST 1 VIEW COMPARISON:  03/21/2016 FINDINGS: Marked elevation of the left hemidiaphragm, progressed since prior study. Diffuse airspace opacities with cardiomegaly and vascular congestion. Findings likely reflect mild edema/ CHF. No visible effusions or pneumothorax. No acute bony abnormality. IMPRESSION: Marked elevation of the left hemidiaphragm. Diffuse  bilateral airspace opacities with cardiomegaly and vascular congestion. Findings likely reflect CHF. Electronically Signed   By: Rolm Baptise M.D.   On: 05/14/2016 10:57      I have personally reviewed and evaluated these images and lab results as part of my medical decision-making.   EKG Interpretation   Date/Time:  Sunday May 14 2016 10:21:10 EDT Ventricular Rate:  103 PR Interval:  169 QRS Duration: 102 QT Interval:  356 QTC Calculation: 466 R Axis:   -25 Text Interpretation:  Sinus tachycardia Premature atrial complexes  Confirmed by Ashok Cordia  MD, Lennette Bihari ([id number]) on 05/14/2016 10:32:08 AM      MDM   Labs.  Continuous pulse ox and monitor.  Patient kept on his normal o2 [place] 6 liters.   Reviewed nursing notes and prior charts for additional history.   Patient confused, hx copd.  Will add abg to workup.  ABG with acute on chronic respiratory failure, resp acidosis.    On review records, and discussion w pt/family - confirmed patients wishes to be DNR, no intubation, no mech vent, no cpr.  Will try bipap.  Respiratory therapist consulted to manage/titrate bipap.   Hospitalists consulted re admission.  CRITICAL CARE  RE:  Acute respiratory failure/respiratory acidosis requiring bipap, chronic resp failure/copd, weakness.  Performed by: Mirna Mires Total critical care time: 40 minutes Critical care time was exclusive of separately billable procedures and treating other patients. Critical care was necessary to treat or prevent imminent or life-threatening deterioration. Critical care was time spent personally by me on the following activities: development of treatment plan with patient and/or surrogate as well as nursing, discussions with consultants, evaluation of patient's response to treatment, examination of patient, obtaining history from patient or surrogate, ordering and performing treatments and interventions, ordering and review of laboratory studies, ordering and review  of radiographic studies, pulse oximetry and re-evaluation of patient's condition.  Pulmonary also consulted, discussed pt with Dr Learta Codding, they will see.  Lajean Saver, MD 05/14/16 1311

## 2016-05-14 NOTE — ED Notes (Signed)
Attempted twice for labs unable to collect

## 2016-05-14 NOTE — Consult Note (Signed)
PULMONARY / CRITICAL CARE MEDICINE   Name: Joshua Conrad MRN: [id number] DOB: 02-11-1946    ADMISSION DATE:  05/14/2016 CONSULTATION DATE:  05/14/2016  REFERRING MD:  Dr. Loleta Books  CHIEF COMPLAINT:  Short of breath  HISTORY OF PRESENT ILLNESS:   70 yo male brought to ER with progressive weakness.  He resides in independent living section of [facility].  He had fall last night/early morning.  He had decrease in appetite.  He has reported hx of COPD and pulmonary fibrosis, and wears 6 liters oxygen at baseline.  He is DNR/DNI.  He was noted to have progressive confusion.  CXR showed pulmonary fibrosis, interstitial edema, and Lt hemidiaphragm elevation.  His ABG showed pH 7.21, PCO2 too high to read, PO2 123.  He has been on mycophenolate for pulmonary fibrosis, and reports being followed at [facility].  He doesn't remember who was seeing at [facility], nor when he was last seen there.  He is not sure if he has been taking mycophenolate recently.  He has hx of Kaposi's sarcoma tx with radiation therapy.  He denies chest or abdominal pain.  He has not had fever or increased cough/sputum.  He has leg swelling which is slightly worse than usual.  His breathing has improved since he was put on Bipap.  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  PAST SURGICAL HISTORY: He  has past surgical history that includes Coronary angioplasty with stent and Cataract extraction.  Allergies  Allergen Reactions  . Prednisone     Caused pt to go crazy     No current facility-administered medications on file prior to encounter.   Current Outpatient Prescriptions on File Prior to Encounter  Medication Sig  . albuterol (PROVENTIL  HFA;VENTOLIN HFA) 108 (90 BASE) MCG/ACT inhaler Inhale 2 puffs into the lungs every 6 (six) hours as needed for wheezing or shortness of breath.  Marland Kitchen albuterol (PROVENTIL) (2.5 MG/3ML) 0.083% nebulizer solution Take 2.5 mg by nebulization every 4 (four) hours as needed for wheezing or shortness of breath.  . allopurinol (ZYLOPRIM) 300 MG tablet Take 300 mg by mouth daily.  . bumetanide (BUMEX) 1 MG tablet Take 1 mg by mouth daily.  . cholecalciferol (VITAMIN D) 400 UNITS TABS tablet Take 400 Units by mouth daily.  . ferrous sulfate 325 (65 FE) MG tablet Take 325 mg by mouth 2 (two) times daily.  . fluticasone (FLONASE) 50 MCG/ACT nasal spray Place 2 sprays into both nostrils at bedtime.  Marland Kitchen losartan (COZAAR) 50 MG tablet Take 50 mg by mouth daily.  . montelukast (SINGULAIR) 10 MG tablet Take 10 mg by mouth at bedtime.  . mycophenolate (CELLCEPT) 500 MG tablet Take 1,500 mg by mouth 2 (two) times daily.  . OXYGEN Inhale 4 L into the lungs continuous.  . potassium chloride (K-DUR) 10 MEQ tablet Take 10 mEq by mouth daily.  . ranitidine (ZANTAC) 150 MG tablet Take 150 mg by mouth 2 (two) times daily.  . simvastatin (ZOCOR) 5 MG tablet Take 5 mg by mouth at bedtime.  . vitamin C (ASCORBIC ACID) 500 MG tablet Take 500 mg by mouth daily.    FAMILY HISTORY:  His has no family status information on file.   SOCIAL HISTORY: He  reports that he has quit smoking. He  does not have any smokeless tobacco history on file. He reports that he does not drink alcohol.  REVIEW OF SYSTEMS:   Negative except above.  SUBJECTIVE:  Wants something to drink.  VITAL SIGNS: BP 115/68 mmHg  Pulse 103  Temp(Src) 97.6 F (36.4 C) (Oral)  Resp 22  SpO2 100%  HEMODYNAMICS:    VENTILATOR SETTINGS: Vent Mode:  [-] BIPAP FiO2 (%):  [40 %] 40 % Set Rate:  [12 bmp] 12 bmp PEEP:  [5 cmH20] 5 cmH20  INTAKE / OUTPUT:    PHYSICAL EXAMINATION: General:  Ill appearing Neuro:  Alert, follows commands, moves all  extremities HEENT:  Kyphotic, Bipap mask on Cardiovascular:  Regular, tachycardic Lungs:  Poor air movement, b/l crackles, no wheeze Abdomen:  Soft, non tender, decreased BS Musculoskeletal:  1+ edema Skin:  Venous stasis changes on legs b/l  LABS:  BMET  Recent Labs Lab 05/14/16 1132  NA 142  K 4.2  CL 91*  CO2 42*  BUN 25*  CREATININE 1.27*  GLUCOSE 104*    Electrolytes  Recent Labs Lab 05/14/16 1132  CALCIUM 10.2    CBC  Recent Labs Lab 05/14/16 1132  WBC 4.0  HGB 9.9*  HCT 31.9*  PLT 171    Coag's No results for input(s): APTT, INR in the last 168 hours.  Sepsis Markers No results for input(s): LATICACIDVEN, PROCALCITON, O2SATVEN in the last 168 hours.  ABG  Recent Labs Lab 05/14/16 1210  PHART 7.213*  PCO2ART ABOVE REPORTABLE RANRANGE  PO2ART 123*    Liver Enzymes  Recent Labs Lab 05/14/16 1132  AST 21  ALT 11*  ALKPHOS 68  BILITOT 0.9  ALBUMIN 4.1    Cardiac Enzymes  Recent Labs Lab 05/14/16 1132  TROPONINI 0.03    Glucose No results for input(s): GLUCAP in the last 168 hours.  Imaging Dg Chest Port 1 View  05/14/2016  CLINICAL DATA:  Fall during the night.  COPD. EXAM: PORTABLE CHEST 1 VIEW COMPARISON:  03/21/2016 FINDINGS: Marked elevation of the left hemidiaphragm, progressed since prior study. Diffuse airspace opacities with cardiomegaly and vascular congestion. Findings likely reflect mild edema/ CHF. No visible effusions or pneumothorax. No acute bony abnormality. IMPRESSION: Marked elevation of the left hemidiaphragm. Diffuse bilateral airspace opacities with cardiomegaly and vascular congestion. Findings likely reflect CHF. Electronically Signed   By: Rolm Baptise M.D.   On: 05/14/2016 10:57     STUDIES:   CULTURES:  ANTIBIOTICS:  SIGNIFICANT EVENTS: 6/04 Admit  LINES/TUBES:  DISCUSSION: 70 yo male with acute encephalopathy from acute on chronic hypoxic/hypercapnic respiratory failure.  He has hx of  pulmonary fibrosis, COPD, Lt diaphragm paralysis, and unspecified CHF.  It is difficult to determine on portable chest xray whether his findings are mostly fibrosis changes, CHF, or both.  He has improvement with BiPAP.  He is DNR/DNI.  ASSESSMENT / PLAN:  PULMONARY A: Acute on chronic hypoxic/hypercapnic respiratory failure. Hx of pulmonary fibrosis, COPD, Lt diaphragm paralysis. P:   Oxygen to keep SpO2 88 to 95% BiPAP qhs and prn during the day Prn BDs Uncertain whether he has been taking mycophenolate >> hold for now  CARDIOVASCULAR A:  Reported hx of CHF >> uncertain whether systolic/diastolic; suspect he has component of cor pulmonale. Hx of CAD, HTN, HLD. P:  Lasix 40 mg bid on 6/04 Monitor hemodynamics Zocor Hold outpt cozaar  RENAL A:   CKD stage 2. P:   Monitor renal fx, urine outpt  GASTROINTESTINAL A:   Nutrition. P:  Full liquid diet  HEMATOLOGIC A:   Anemia of critical illness and chronic disease. P:  F/u CBC Lovenox for DVT prevention  INFECTIOUS A:   No evidence for infection. P:   Monitor clinically  ENDOCRINE A:   No acute issues. P:   Monitor blood sugar on BMET  NEUROLOGIC A:   Acute encephalopathy 2nd to respiratory failure >> improved with BiPAP. P:   Monitor mental status  Goals of Care >> DNR/DNI.  Disposition >> he has been residing at independent living center >> he might need higher level of care after discharge.  CC time 38 minutes.  Chesley Mires, MD [facility] Pulmonary/Critical Care 05/14/2016, 2:35 PM Pager:  [phone] After 3pm call: [phone]

## 2016-05-14 NOTE — ED Notes (Signed)
@   [facility] consultant made aware of 66min timer started.

## 2016-05-14 NOTE — Progress Notes (Signed)
Pt taken off BIPAP and placed on 5 LPM  for break.  Pt tolerating well at this time, RT to monitor and assess as needed.  Pt to be placed back on BIPAP QHS

## 2016-05-14 NOTE — ED Notes (Signed)
Called [facility] at [phone], to see if could get more medical history on patient as he isn't very good historian.  Was told they are independent living so they don't keep paperwork or medical history.  Was instructed they notified patient's sister in law, Bethena Roys, who should be coming to ED.

## 2016-05-14 NOTE — ED Notes (Signed)
MD at bedside. 

## 2016-05-14 NOTE — ED Notes (Signed)
Bed: [id number] Expected date:  Expected time:  Means of arrival:  Comments: EMS- 70 yo gen weakness x1 year

## 2016-05-14 NOTE — ED Notes (Signed)
Respiratory at bedside.

## 2016-05-14 NOTE — ED Notes (Signed)
Respiratory made aware of patient needing to transport to [facility].

## 2016-05-14 NOTE — H&P (Signed)
History and Physical  Patient Name: Joshua Conrad     [id number]    DOB: 02-22-1946    DOA: 05/14/2016 PCP: Sandi Mariscal, MD   Patient coming from: Independent living facility  Chief Complaint: Confusion  HPI: Joshua Conrad is a 70 y.o. male with a past medical history significant for pulmonary fibrosis treated at [facility] with MMF, COPD, OSA not on CPAP, CHF unclear EF, and CAD who presents with decreased mental status over days to weeks.  All history collected from family, as patient is unable to provide history himself.  They report that he was last seen by them on Friday, and seemed his normal self, although maybe a little off, with "periods of confusion".  Then, today, the patient was found down early this morning by a caregiver (last time seen by them would have been last night at 7 or 8 pm).  He was lying on the floor, sluggish, unable to say what had happened, appeared somnolent and slow to respond and kept falling asleep.     ED course: -Afebrile, tachycardic, BP normal, saturating well on oxygen by nasal cannula at home rate -Patient intermittently responsive to questions -Na 142, K 4.2, Cr 1.27 (baseline 1.0), WBC 3.0, Hgb 9.9, BNP low -CXR showed an elevated left hemidiaphragm, small left lung volume, bilateral opacities consistent with both CHF and peripheral fibrosis -ABG showed pH 7.2, pCO2 "too high to read" and pO2 normal on supplemental O2  BiPAP was started and TRH was consulted as well as PCCM.    Recently had O2 decreased from 6 to 4L at night.  Used to use a CPAP, but in last three years, lost 200 lbs, and now doesn't use one.      Review of Systems:  Unable to obtain due to patient mentation.    Past Medical History  Diagnosis Date  . Pulmonary fibrosis ([place])   . MI (mitral incompetence)   . Hypertension   . Hyperlipidemia   . Coronary artery disease   . CHF (congestive heart failure) ([place])   . COPD (chronic obstructive pulmonary disease) ([place])   . 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 OSA (obstructive sleep apnea)   . Diaphragm paralysis     Left hemidiaphragm    Past Surgical History  Procedure Laterality Date  . Coronary angioplasty with stent placement    . Cataract extraction      Social History: Patient lives at independent living facility.  Ambulatory status not known at baseline.  Former smoker.  POA is sister in law Samul Dada.  Allergies  Allergen Reactions  . Prednisone     Caused pt to go crazy     Family history: family history includes Hypertension in his father and mother.  Prior to Admission medications   Medication Sig Start Date End Date Taking? Authorizing Provider  albuterol (PROVENTIL HFA;VENTOLIN HFA) 108 (90 BASE) MCG/ACT inhaler Inhale 2 puffs into the lungs every 6 (six) hours as needed for wheezing or shortness of breath.   Yes Historical Provider, MD  albuterol (PROVENTIL) (2.5 MG/3ML) 0.083% nebulizer solution Take 2.5 mg by nebulization every 4 (four) hours as needed for wheezing or shortness of breath.   Yes Historical Provider, MD  allopurinol (ZYLOPRIM) 300 MG tablet Take 300 mg by mouth daily.   Yes Historical Provider, MD  bumetanide (BUMEX) 1 MG tablet Take 1 mg by mouth daily.   Yes Historical Provider, MD  cholecalciferol (VITAMIN D) 400 UNITS TABS tablet Take 400 Units by mouth  daily.   Yes Historical Provider, MD  ferrous sulfate 325 (65 FE) MG tablet Take 325 mg by mouth 2 (two) times daily.   Yes Historical Provider, MD  fluticasone (FLONASE) 50 MCG/ACT nasal spray Place 2 sprays into both nostrils at bedtime.   Yes Historical Provider, MD  losartan (COZAAR) 50 MG tablet Take 50 mg by mouth daily.   Yes Historical Provider, MD  montelukast (SINGULAIR) 10 MG tablet Take 10 mg by mouth at bedtime.   Yes Historical Provider, MD  mycophenolate (CELLCEPT) 500 MG tablet Take 1,500 mg by mouth 2 (two) times daily.   Yes Historical Provider, MD  OXYGEN Inhale 4 L into the lungs continuous.   Yes Historical Provider,  MD  potassium chloride (K-DUR) 10 MEQ tablet Take 10 mEq by mouth daily.   Yes Historical Provider, MD  ranitidine (ZANTAC) 150 MG tablet Take 150 mg by mouth 2 (two) times daily.   Yes Historical Provider, MD  simvastatin (ZOCOR) 5 MG tablet Take 5 mg by mouth at bedtime.   Yes Historical Provider, MD  vitamin C (ASCORBIC ACID) 500 MG tablet Take 500 mg by mouth daily.   Yes Historical Provider, MD       Physical Exam: BP 111/81 mmHg  Pulse 104  Temp(Src) 97.7 F (36.5 C) (Axillary)  Resp 21  Ht 6\' 2"  (1.88 m)  Wt 87.2 kg (192 lb 3.9 oz)  BMI 24.67 kg/m2  SpO2 100% General appearance: Thin adult male, somnolent, rousable to pain, but unable to follow commands, does not make intelligible responses on my exam.  On BiPAP.  Eyes: Anicteric, conjunctiva pink, lids and lashes normal.     ENT: No nasal deformity, discharge, or epistaxis.  OP dry.  BiPAP obscures mouth.   Lymph: No cervical or supraclavicular lymphadenopathy. Skin: Warm and dry.  No jaundice.  No suspicious rashes or lesions. Cardiac: Tachycardic, regular, nl S1-S2, no murmurs appreciated.  Capillary refill is brisk.  JVP normal.  Marked LE edema, pitting, to knees, chronic venous stasis changes, pulses dopplerable in feet. Respiratory: Normal respiratory rate and rhythm on BiPAP.  CTAB without rales or wheezes on my exam. Abdomen: Abdomen soft without rigidity.  No ascites, distension.   MSK: No deformities or effusions. Neuro: Unresponsive, pulls away from noxiuos stimuli, no intelligible verbalizations to me, does not follow commands.  Psych: Unable to assess.    Labs on Admission:  I have personally reviewed following labs and imaging studies: CBC:  Recent Labs Lab 05/14/16 1132  WBC 4.0  HGB 9.9*  HCT 31.9*  MCV 90.9  PLT [id number]   Basic Metabolic Panel:  Recent Labs Lab 05/14/16 1132  NA 142  K 4.2  CL 91*  CO2 42*  GLUCOSE 104*  BUN 25*  CREATININE 1.27*  CALCIUM 10.2   GFR: Estimated  Creatinine Clearance: 63.8 mL/min (by C-G formula based on Cr of 1.27). Liver Function Tests:  Recent Labs Lab 05/14/16 1132  AST 21  ALT 11*  ALKPHOS 68  BILITOT 0.9  PROT 6.8  ALBUMIN 4.1   No results for input(s): LIPASE, AMYLASE in the last 168 hours. No results for input(s): AMMONIA in the last 168 hours. Coagulation Profile: No results for input(s): INR, PROTIME in the last 168 hours. Cardiac Enzymes:  Recent Labs Lab 05/14/16 1132  TROPONINI 0.03   BNP (last 3 results) No results for input(s): PROBNP in the last 8760 hours. HbA1C: No results for input(s): HGBA1C in the last 72 hours. CBG: No  results for input(s): GLUCAP in the last 168 hours. Lipid Profile: No results for input(s): CHOL, HDL, LDLCALC, TRIG, CHOLHDL, LDLDIRECT in the last 72 hours. Thyroid Function Tests: No results for input(s): TSH, T4TOTAL, FREET4, T3FREE, THYROIDAB in the last 72 hours. Anemia Panel: No results for input(s): VITAMINB12, FOLATE, FERRITIN, TIBC, IRON, RETICCTPCT in the last 72 hours. Sepsis Labs: @LABRCNTIP (procalcitonin:4,lacticidven:4) )No results found for this or any previous visit (from the past 240 hour(s)).       Radiological Exams on Admission: Personally reviewed: Dg Chest Port 1 View  05/14/2016  CLINICAL DATA:  Fall during the night.  COPD. EXAM: PORTABLE CHEST 1 VIEW COMPARISON:  03/21/2016 FINDINGS: Marked elevation of the left hemidiaphragm, progressed since prior study. Diffuse airspace opacities with cardiomegaly and vascular congestion. Findings likely reflect mild edema/ CHF. No visible effusions or pneumothorax. No acute bony abnormality. IMPRESSION: Marked elevation of the left hemidiaphragm. Diffuse bilateral airspace opacities with cardiomegaly and vascular congestion. Findings likely reflect CHF. Electronically Signed   By: Rolm Baptise M.D.   On: 05/14/2016 10:57    EKG: Independently reviewed. Rate 103, QTc normal, RSR' pattern, no ST  changes.    Assessment/Plan 1. 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  PCO2 now markedly elevated. -Admit to stepdown -BiPAP continuous and PRN until more alert -ABG repeat in 6 hours -Consult to PCCM, appreciate cares -Repeat CXR at midnight   2. HTN:  Normotensive at admission -Hold losartan and bumetanide for now -Continue statin  3. CHF, unknown EF:  -Furosemide BID for today, reassess tomorrow -Tele -Strict I/Os -Daily BMP  4. COPD:  -Continue Duoneb PRN -Continue Singulair, Pepcid  5. Pulmonary fibrosis: -Obtain records from [place], which are for some reason not available in Care Everywhere -Hold mycophenolate for now    DVT prophylaxis: Lovenox  Code Status: DO NOT RESUSCITATE  Family Communication: Samul Dada ([place]), sister in law and [place], cell phone [phone].  CODE STATUS confirmed.  Plan discussed.  Disposition Plan: Anticipate BiPAP overnight and repeat ABG. Consults called: Pulmonary Admission status: Inpatient, stepdown   Medical decision making: Patient seen at 3:48 PM on 05/14/2016.  The patient was discussed with Dr. Ashok Cordia. What exists of the patient's chart was reviewed in depth.  Clinical condition: requiring BiPAP, currently unresponsive, hemodynamically stable.        Edwin Dada [facility] Pager [phone]

## 2016-05-15 ENCOUNTER — Inpatient Hospital Stay (HOSPITAL_COMMUNITY): Payer: Medicare Other

## 2016-05-15 DIAGNOSIS — J9621 Acute and chronic respiratory failure with hypoxia: Secondary | ICD-10-CM | POA: Insufficient documentation

## 2016-05-15 DIAGNOSIS — I509 Heart failure, unspecified: Secondary | ICD-10-CM

## 2016-05-15 DIAGNOSIS — J9622 Acute and chronic respiratory failure with hypercapnia: Secondary | ICD-10-CM

## 2016-05-15 LAB — ECHOCARDIOGRAM COMPLETE
FS: 11 % — AB (ref 28–44)
Height: 74 in
IV/PV OW: 0.82
LADIAMINDEX: 0.75 cm/m2
LASIZE: 16 cm
LAVOLA4C: 79.4 mL
LEFT ATRIUM END SYS DIAM: 16 cm
LV PW d: 11.9 mm — AB (ref 0.6–1.1)
LV e' LATERAL: 10.6 cm/s
TDI e' lateral: 10.6
TDI e' medial: 8.92
WEIGHTICAEL: 3058.22 [oz_av]

## 2016-05-15 LAB — BASIC METABOLIC PANEL
Anion gap: 10 (ref 5–15)
BUN: 24 mg/dL — ABNORMAL HIGH (ref 6–20)
CALCIUM: 9.7 mg/dL (ref 8.9–10.3)
CO2: 42 mmol/L — ABNORMAL HIGH (ref 22–32)
CREATININE: 1.16 mg/dL (ref 0.61–1.24)
Chloride: 91 mmol/L — ABNORMAL LOW (ref 101–111)
GFR calc non Af Amer: >60 mL/min (ref >60)
Glucose, Bld: 75 mg/dL (ref 65–99)
Potassium: 3.8 mmol/L (ref 3.5–5.1)
SODIUM: 143 mmol/L (ref 135–145)

## 2016-05-15 LAB — CBC
HCT: 27.3 % — ABNORMAL LOW (ref 39.0–52.0)
Hemoglobin: 8.5 g/dL — ABNORMAL LOW (ref 13.0–17.0)
MCH: 28.4 pg (ref 26.0–34.0)
MCHC: 31.1 g/dL (ref 30.0–36.0)
MCV: 91.3 fL (ref 78.0–100.0)
Platelets: 160 10*3/uL (ref 150–400)
RBC: 2.99 MIL/uL — ABNORMAL LOW (ref 4.22–5.81)
RDW: 14.4 % (ref 11.5–15.5)
WBC: 3.2 10*3/uL — ABNORMAL LOW (ref 4.0–10.5)

## 2016-05-15 LAB — BRAIN NATRIURETIC PEPTIDE: B NATRIURETIC PEPTIDE 5: 22.2 pg/mL (ref 0.0–100.0)

## 2016-05-15 LAB — SEDIMENTATION RATE: SED RATE: 30 mm/h — AB (ref 0–16)

## 2016-05-15 MED ORDER — MYCOPHENOLATE MOFETIL 250 MG PO CAPS
1500.0000 mg | ORAL_CAPSULE | Freq: Two times a day (BID) | ORAL | Status: DC
  Administered 2016-05-15 – 2016-05-16 (×2): 1500 mg via ORAL
  Administered 2016-05-16: 500 mg via ORAL
  Administered 2016-05-17 (×2): 1500 mg via ORAL
  Filled 2016-05-15 (×9): qty 6

## 2016-05-15 MED ORDER — ENSURE ENLIVE PO LIQD
237.0000 mL | Freq: Two times a day (BID) | ORAL | Status: DC
  Administered 2016-05-15 – 2016-05-18 (×6): 237 mL via ORAL

## 2016-05-15 MED ORDER — FUROSEMIDE 10 MG/ML IJ SOLN
40.0000 mg | Freq: Two times a day (BID) | INTRAMUSCULAR | Status: DC
  Administered 2016-05-15 – 2016-05-17 (×6): 40 mg via INTRAVENOUS
  Filled 2016-05-15 (×6): qty 4

## 2016-05-15 MED ORDER — IPRATROPIUM-ALBUTEROL 0.5-2.5 (3) MG/3ML IN SOLN: 3.0000 mL | Freq: Four times a day (QID) | RESPIRATORY_TRACT | Status: DC

## 2016-05-15 NOTE — Care Management Note (Signed)
Case Management Note  Patient Details  Name: Joshua Conrad MRN: [id number] Date of Birth: 1946-10-14  Subjective/Objective:         resp requiring o2 and bipap           Action/Plan:Date:  May 15, 2016 Chart reviewed for concurrent status and case management needs. Will continue to follow the patient for changes and needs: Expected discharge date: [id number] Velva Harman, BSN, [place], [place]   Expected Discharge Date:                  Expected Discharge Plan:  Home/Self Care  In-House Referral:  NA  Discharge planning Services  CM Consult  Post Acute Care Choice:  NA Choice offered to:  NA  DME Arranged:  N/A DME Agency:  NA  HH Arranged:  NA HH Agency:  NA  Status of Service:  In process, will continue to follow  Medicare Important Message Given:    Date Medicare IM Given:    Medicare IM give by:    Date Additional Medicare IM Given:    Additional Medicare Important Message give by:     If discussed at [place] of Stay Meetings, dates discussed:    Additional Comments:  Leeroy Cha, RN 05/15/2016, 10:37 AM

## 2016-05-15 NOTE — Progress Notes (Signed)
Spoke with Abby Potash (sister-in-law) who is the current caregiver for patient. She is voicing several concerns. She would like specifically to speak with someone from social work about placing the patient in a skilled nursing facility upon discharge as she states "his quality of life is declining quickly and it is getting harder and harder for me to get him to his doctors appointments." She is also voicing concerns about patients medication CELLCEPT and would like more education on how that medication is being managed. If possible she would like an update from the pulmonologist and social worker tomorrow (05/16/2016). Phone numbers for her: Cell [phone], Work [phone].

## 2016-05-15 NOTE — Progress Notes (Signed)
Pt removed from BIPAP and placed on 5 LPM [place], Pt tolerating well at this time.  Pt in no distress and is alert and oriented at this time.  RT to monitor and assess as needed.

## 2016-05-15 NOTE — Progress Notes (Signed)
RT called to bedside due to desats with Black box BIPAP.  Pt switched back to NIV/PC through [facility] due to receiving meds to help him rest.  Pt tolerating well at this time, RT to monitor and assess as needed.

## 2016-05-15 NOTE — Procedures (Signed)
Placed patient on BIPAP 12/6, 5L 02 for the night.  Patient is tolerating well at this time.

## 2016-05-15 NOTE — Progress Notes (Signed)
*  PRELIMINARY RESULTS* Echocardiogram ECho has been performed.  Leavy Cella 05/15/2016, 12:20 PM

## 2016-05-15 NOTE — Progress Notes (Signed)
PULMONARY / CRITICAL CARE MEDICINE   Name: Joshua Conrad MRN: [id number] DOB: Jan 28, 1946    ADMISSION DATE:  05/14/2016 CONSULTATION DATE:  05/14/2016  REFERRING MD:  Dr. Loleta Books  CHIEF COMPLAINT:  Short of breath  SUBJECTIVE:  Breathing better.  More alert.  Says he was last seen at [facility] for lung fibrosis in 2014.  VITAL SIGNS: BP 117/71 mmHg  Pulse 97  Temp(Src) 98.5 F (36.9 C) (Oral)  Resp 17  Ht 6\' 2"  (1.88 m)  Wt 191 lb 2.2 oz (86.7 kg)  BMI 24.53 kg/m2  SpO2 100%  INTAKE / OUTPUT: I/O last 3 completed shifts: In: 80.8 [I.V.:40.8; Other:40] Out: 1405 [Urine:1405]  PHYSICAL EXAMINATION: General: pleasant Neuro:  Alert, follows commands, moves all extremities HEENT: no stridor Cardiovascular:  Regular Lungs: better air movement, faint b/l crackles, no wheeze Abdomen:  Soft, non tender, decreased BS Musculoskeletal:  1+ edema Skin:  Venous stasis changes on legs b/l  LABS:  BMET  Recent Labs Lab 05/14/16 1132 05/15/16 0349  NA 142 143  K 4.2 3.8  CL 91* 91*  CO2 42* 42*  BUN 25* 24*  CREATININE 1.27* 1.16  GLUCOSE 104* 75    Electrolytes  Recent Labs Lab 05/14/16 1132 05/15/16 0349  CALCIUM 10.2 9.7    CBC  Recent Labs Lab 05/14/16 1132 05/15/16 0349  WBC 4.0 3.2*  HGB 9.9* 8.5*  HCT 31.9* 27.3*  PLT 171 160    Coag's No results for input(s): APTT, INR in the last 168 hours.  Sepsis Markers No results for input(s): LATICACIDVEN, PROCALCITON, O2SATVEN in the last 168 hours.  ABG  Recent Labs Lab 05/14/16 1210 05/14/16 2035  PHART 7.213* 7.329*  PCO2ART ABOVE REPORTABLE RANRANGE 84.0*  PO2ART 123* 72.5*    Liver Enzymes  Recent Labs Lab 05/14/16 1132  AST 21  ALT 11*  ALKPHOS 68  BILITOT 0.9  ALBUMIN 4.1    Cardiac Enzymes  Recent Labs Lab 05/14/16 1132  TROPONINI 0.03    Glucose No results for input(s): GLUCAP in the last 168 hours.  Imaging Dg Chest Port 1 View  05/15/2016  CLINICAL DATA:  Respiratory  failure, CHF EXAM: PORTABLE CHEST 1 VIEW COMPARISON:  05/14/2016 FINDINGS: Continued marked elevation of the left hemidiaphragm. Diffuse bilateral airspace disease again noted with slight improvement. Suspect mild cardiomegaly. No visible effusions or acute bony abnormality. IMPRESSION: Continued low lung volumes with marked elevation of the left hemidiaphragm. Slight improvement in diffuse bilateral airspace disease. Electronically Signed   By: Rolm Baptise M.D.   On: 05/15/2016 07:14   Dg Chest Port 1 View  05/14/2016  CLINICAL DATA:  Fall during the night.  COPD. EXAM: PORTABLE CHEST 1 VIEW COMPARISON:  03/21/2016 FINDINGS: Marked elevation of the left hemidiaphragm, progressed since prior study. Diffuse airspace opacities with cardiomegaly and vascular congestion. Findings likely reflect mild edema/ CHF. No visible effusions or pneumothorax. No acute bony abnormality. IMPRESSION: Marked elevation of the left hemidiaphragm. Diffuse bilateral airspace opacities with cardiomegaly and vascular congestion. Findings likely reflect CHF. Electronically Signed   By: Rolm Baptise M.D.   On: 05/14/2016 10:57     STUDIES:   CULTURES:  ANTIBIOTICS:  SIGNIFICANT EVENTS: 6/04 Admit  LINES/TUBES:  DISCUSSION: 70 yo male with acute encephalopathy from acute on chronic hypoxic/hypercapnic respiratory failure.  He has hx of pulmonary fibrosis, COPD, Lt diaphragm paralysis, and unspecified CHF.  It is difficult to determine on portable chest xray whether his findings are mostly fibrosis changes, CHF, or both.  He has improvement with BiPAP.  He is DNR/DNI.  ASSESSMENT / PLAN:  Acute on chronic hypoxic/hypercapnic respiratory failure. Hx of pulmonary fibrosis, COPD, Lt diaphragm paralysis. P:   Oxygen to keep SpO2 88 to 95% BiPAP qhs and prn during the day Prn BDs Resume mycophenolate 6/05 >> he will need outpt f/u at [facility] to determine if he should continue this long term  Reported hx of CHF >>  uncertain whether systolic/diastolic; suspect he has component of cor pulmonale. Hx of CAD, HTN, HLD. P:  Lasix per primary team Monitor hemodynamics Zocor Was on cozaar as outpt >> defer to primary team  CKD stage 2. P:   Monitor renal fx, urine outpt  Nutrition. P:   Change to heart healthy diet  Acute encephalopathy 2nd to respiratory failure >> much improved with BiPAP. P:   Monitor mental status  Goals of Care >> DNR/DNI.  Disposition >> he has been residing at independent living center >> he might need higher level of care after discharge.  Summary: Respiratory and mental status much improved.  He needs to continue with BiPAP qhs.  He needs to have f/u with [facility] to monitor therapy for pulmonary fibrosis.  PCCM will sign off.  Please call if additional help needed while he is in hospital.  Chesley Mires, MD [place] 05/15/2016, 8:58 AM Pager:  [phone] After 3pm call: [phone]

## 2016-05-15 NOTE — Progress Notes (Signed)
Initial Nutrition Assessment  DOCUMENTATION CODES:   Severe malnutrition in context of chronic illness  INTERVENTION:  -Ensure Enlive po BID, each supplement provides 350 kcal and 20 grams of protein -RD to continue to monitor  NUTRITION DIAGNOSIS:   Malnutrition related to chronic illness as evidenced by severe depletion of body fat, severe depletion of muscle mass.  GOAL:   Patient will meet greater than or equal to 90% of their needs  MONITOR:   PO intake, Supplement acceptance, I & O's, Labs, Weight trends  REASON FOR ASSESSMENT:   Low Braden    ASSESSMENT:   Joshua Conrad is a 70 y.o. male with a past medical history significant for pulmonary fibrosis treated at [facility] with MMF, COPD, OSA not on CPAP, CHF unclear EF, and CAD who presents with decreased mental status over days to weeks.  Joshua Conrad is a pleasant 70 yo man who presents with poor po intake over the past two days, identified as a low braden. He states that he started feeling ill on Saturday and had not eaten or drank much, presented to the hospital on Sunday where he remained NPO, and got his first tray today. He is on chronic cellcept, which he claims if he doesn't receive, will cause him to have nausea and dry heave.  He had grits this morning along with other food on his tray he could not recall, states he ate most of the grits. He recently had all of his top teeth pulled, and does a soft diet at home, no dentures. He does complain of food "not tasting good, not smelling good, and not looking good." Causes him not to want to eat, but that seems to be improving some.  He endorses weight loss of ~150# over 4 years, which was intentional.  No wt history in chart otherwise.  Nutrition-Focused physical exam completed. Findings are severe fat depletion, severe muscle depletion, and no edema.   He is likely not meeting his needs.  Labs and Medications reviewed: FESo4 325mg  PO BID; Cellcept 1500mg  BID; Vit D 400iU  Daily;   Diet Order:  Diet Heart Room service appropriate?: Yes; Fluid consistency:: Thin  Skin:  Reviewed, no issues  Last BM:  PTA  Height:   Ht Readings from Last 1 Encounters:  05/14/16 6\' 2"  (1.88 m)    Weight:   Wt Readings from Last 1 Encounters:  05/15/16 191 lb 2.2 oz (86.7 kg)    Ideal Body Weight:  86.36 kg  BMI:  Body mass index is 24.53 kg/(m^2).  Estimated Nutritional Needs:   Kcal:  [id number]  Protein:  85-105 grams  Fluid:  1.2L  EDUCATION NEEDS:   No education needs identified at this time  Satira Anis. Ji Feldner, MS, RD LDN Inpatient Clinical Dietitian Pager [phone]

## 2016-05-15 NOTE — Progress Notes (Addendum)
PROGRESS NOTE    Joshua Conrad  [id number] DOB: 1946-11-02 DOA: 05/14/2016 PCP: Sandi Mariscal, MD  Brief Narrative: Joshua Conrad is a 70 y.o. male with a past medical history significant for pulmonary fibrosis treated at [facility] with MMF, COPD, Chronic resp failure on 4L O2, OSA, on home BIPAP, CHF unclear EF, and CAD who presented with decreased mental status over days to weeks. Family reported that he was last seen by them on Friday, and seemed his normal self, although maybe a little off, with "periods of confusion". Then 6/4 was found lying on the floor, sluggish and somnolent. In ED, CXR with  bilateral opacities consistent with both CHF and peripheral fibrosis -ABG showed pH 7.2, pCO2 "too high to read" and pO2 normal on supplemental O2 -Admitted to SDU, improved with BIPAP, lasix, PCCM consulted, resumed Myfortic,  -6/6: Tx out of SDU  Assessment & Plan:  1. 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 - triggered by Acute Diastolic CHF too -off BIPAP since yesterday, used BIPAP Qhs now -continue IV lasix today,  ECHO with preserved EF and Grade 1DD, surprisingly PA pressures normal -improving, mentation back to normal -Tx to floor, Ambulate, PT/OT eval  2. HTN:  -soft stable, continue IV lasix today -Hold losartan and bumetanide for now -Continue statin  3. Acute diastolic CHF -continue IV lasix 1 more day, swelling starting to improve too -negative 2.9L  -ECHO with grade 1 DD -will need low dose lasix vs bumetanide at DC  4. COPD:  -Continue Duoneb PRN -Continue Singulair, Pepcid  5. Pulmonary fibrosis: -requested records from [place], not visible in Care Everywhere -Mycophenolate resumed by Pulm, FU at [facility] -last seen 3years ago per pt, wasn't able to FU last year, he intents to this year  6. CHronic anemia -close to  previous levels, could be related to Mycophenolate -anemia panel in 11/16 was c/w chronic disease  7. H/o Kaposi sarcoma -treated with XRT  DVT prophylaxis: Lovenox   Code Status: DO NOT RESUSCITATE  Family Communication: no family at bedside Disposition Plan: Tx out of SDU, rehab vs [facility] with [facility] in 1-2days  Consultants:   PCCM   Subjective: Mentation much improved, breathing better, swelling improving  Objective: Filed Vitals:   05/15/16 0500 05/15/16 0506 05/15/16 0600 05/15/16 0754  BP: 105/57  117/71   Pulse: 101 96 97   Temp:    98.5 F (36.9 C)  TempSrc:    Oral  Resp: 22 23 17    Height:      Weight:      SpO2: 90% 100% 100%     Intake/Output Summary (Last 24 hours) at 05/15/16 1008 Last data filed at 05/15/16 0755  Gross per 24 hour  Intake  80.83 ml  Output   1555 ml  Net -1474.17 ml   Filed Weights   05/14/16 1409 05/15/16 0447  Weight: 87.2 kg (192 lb 3.9 oz) 86.7 kg (191 lb 2.2 oz)    Examination:  General exam: awake , alert, conversant, oriented x3 Respiratory system: b/l crackles noted unchanged Cardiovascular system: S1 & S2 heard, RRR. No JVD, murmurs, rubs, gallops or clicks Gastrointestinal system: Abdomen is nondistended, soft and nontender. No organomegaly or masses felt. Normal bowel sounds  Central nervous system: Alert and oriented. No focal neurological deficits. Extremities: Symmetric 5 x 5 power, lower leg swelling improved still with swelling on both  feet Skin: No rashes, lesions or ulcers Psychiatry: appropriate    Data Reviewed: I have personally reviewed following labs and imaging studies  CBC:  Recent Labs Lab 05/14/16 1132 05/15/16 0349  WBC 4.0 3.2*  HGB 9.9* 8.5*  HCT 31.9* 27.3*  MCV 90.9 91.3  PLT 171 0000000   Basic Metabolic Panel:  Recent Labs Lab 05/14/16 1132 05/15/16 0349  NA 142 143  K 4.2 3.8  CL 91* 91*  CO2 42* 42*  GLUCOSE 104* 75  BUN 25* 24*  CREATININE 1.27* 1.16  CALCIUM 10.2 9.7    GFR: Estimated Creatinine Clearance: 69.9 mL/min (by C-G formula based on Cr of 1.16). Liver Function Tests:  Recent Labs Lab 05/14/16 1132  AST 21  ALT 11*  ALKPHOS 68  BILITOT 0.9  PROT 6.8  ALBUMIN 4.1   No results for input(s): LIPASE, AMYLASE in the last 168 hours. No results for input(s): AMMONIA in the last 168 hours. Coagulation Profile: No results for input(s): INR, PROTIME in the last 168 hours. Cardiac Enzymes:  Recent Labs Lab 05/14/16 1132  TROPONINI 0.03   BNP (last 3 results) No results for input(s): PROBNP in the last 8760 hours. HbA1C: No results for input(s): HGBA1C in the last 72 hours. CBG: No results for input(s): GLUCAP in the last 168 hours. Lipid Profile: No results for input(s): CHOL, HDL, LDLCALC, TRIG, CHOLHDL, LDLDIRECT in the last 72 hours. Thyroid Function Tests: No results for input(s): TSH, T4TOTAL, FREET4, T3FREE, THYROIDAB in the last 72 hours. Anemia Panel: No results for input(s): VITAMINB12, FOLATE, FERRITIN, TIBC, IRON, RETICCTPCT in the last 72 hours. Urine analysis:    Component Value Date/Time   COLORURINE YELLOW 05/14/2016 1750   APPEARANCEUR CLEAR 05/14/2016 1750   LABSPEC 1.014 05/14/2016 1750   PHURINE 6.0 05/14/2016 1750   GLUCOSEU NEGATIVE 05/14/2016 1750   HGBUR NEGATIVE 05/14/2016 1750   BILIRUBINUR NEGATIVE 05/14/2016 1750   KETONESUR NEGATIVE 05/14/2016 1750   PROTEINUR NEGATIVE 05/14/2016 1750   NITRITE NEGATIVE 05/14/2016 1750   LEUKOCYTESUR NEGATIVE 05/14/2016 1750   Sepsis Labs: @LABRCNTIP (procalcitonin:4,lacticidven:4)  ) Recent Results (from the past 240 hour(s))  MRSA PCR Screening     Status: None   Collection Time: 05/14/16  5:39 PM  Result Value Ref Range Status   MRSA by PCR NEGATIVE NEGATIVE Final    Comment:        The GeneXpert MRSA Assay (FDA approved for NASAL specimens only), is one component of a comprehensive MRSA colonization surveillance program. It is not intended to  diagnose MRSA infection nor to guide or monitor treatment for MRSA infections.          Radiology Studies: Dg Chest Port 1 View  05/15/2016  CLINICAL DATA:  Respiratory failure, CHF EXAM: PORTABLE CHEST 1 VIEW COMPARISON:  05/14/2016 FINDINGS: Continued marked elevation of the left hemidiaphragm. Diffuse bilateral airspace disease again noted with slight improvement. Suspect mild cardiomegaly. No visible effusions or acute bony abnormality. IMPRESSION: Continued low lung volumes with marked elevation of the left hemidiaphragm. Slight improvement in diffuse bilateral airspace disease. Electronically Signed   By: Rolm Baptise M.D.   On: 05/15/2016 07:14   Dg Chest Port 1 View  05/14/2016  CLINICAL DATA:  Fall during the night.  COPD. EXAM: PORTABLE CHEST 1 VIEW COMPARISON:  03/21/2016 FINDINGS: Marked elevation of the left hemidiaphragm, progressed since prior study. Diffuse airspace opacities with cardiomegaly and vascular congestion. Findings likely reflect mild edema/ CHF. No visible effusions or pneumothorax. No acute bony  abnormality. IMPRESSION: Marked elevation of the left hemidiaphragm. Diffuse bilateral airspace opacities with cardiomegaly and vascular congestion. Findings likely reflect CHF. Electronically Signed   By: Rolm Baptise M.D.   On: 05/14/2016 10:57        Scheduled Meds: . allopurinol  300 mg Oral Daily  . antiseptic oral rinse  7 mL Mouth Rinse q12n4p  . chlorhexidine  15 mL Mouth Rinse BID  . cholecalciferol  400 Units Oral Daily  . enoxaparin (LOVENOX) injection  40 mg Subcutaneous Q24H  . famotidine  20 mg Oral QHS  . ferrous sulfate  325 mg Oral BID WC  . fluticasone  2 spray Each Nare QHS  . furosemide  40 mg Intravenous BID  . montelukast  10 mg Oral QHS  . mycophenolate  1,500 mg Oral BID  . simvastatin  5 mg Oral QHS  . vitamin C  500 mg Oral Daily   Continuous Infusions: . sodium chloride 10 mL/hr at 05/14/16 2000     LOS: 1 day    Time spent:  34min    Domenic Polite, MD [facility] Pager [phone]  If 7PM-7AM, please contact night-coverage [email or web address] Password [email or web address] 05/15/2016, 10:08 AM

## 2016-05-15 NOTE — Progress Notes (Signed)
RT placed Pt on NIV/PC through Vent and Pt constantly complaining about mask and the way it fits.  RT tired another mask and placed foam padding on pressure points around face to ensure comfort.  Pt still complaining and stating he can't breathe.  RT placed Pt on Black box BIPAP 12/6 with large mask with 6 LPM O2 bleed in.  Pt tolerating much better and states that he is comfortable at this time.  RT to monitor and assess as needed.

## 2016-05-15 NOTE — Progress Notes (Deleted)
PULMONARY / CRITICAL CARE MEDICINE   Name: Mamie Pecore MRN: [id number] DOB: August 21, 1946    ADMISSION DATE:  05/14/2016 CONSULTATION DATE:  05/14/2016  REFERRING MD:  Dr. Loleta Books  CHIEF COMPLAINT:  Short of breath  HISTORY OF PRESENT ILLNESS:   70 yo male w/ COPD and pulmonary fibrosis (previously on mycophenolate thru [facility]), and wears 6 liters oxygen at baseline.  He is DNR/DNI.  He was noted to have progressive confusion.  CXR showed pulmonary fibrosis, interstitial edema, and Lt hemidiaphragm elevation.  His ABG showed pH 7.21, PCO2 too high to read, PO2 123. He has hx of Kaposi's sarcoma tx with radiation therapy. Admitted w/ worsening resp failure, AMS and LE swelling. Improved w/ PPV   SUBJECTIVE:  Still feels short of breath   VITAL SIGNS: BP 117/71 mmHg  Pulse 97  Temp(Src) 98.5 F (36.9 C) (Oral)  Resp 17  Ht 6\' 2"  (1.88 m)  Wt 191 lb 2.2 oz (86.7 kg)  BMI 24.53 kg/m2  SpO2 100% 5 liters  HEMODYNAMICS:    VENTILATOR SETTINGS: Vent Mode:  [-] BIPAP FiO2 (%):  [30 %-100 %] 30 % Set Rate:  [12 bmp-16 bmp] 16 bmp PEEP:  [5 cmH20] 5 cmH20  INTAKE / OUTPUT:  Intake/Output Summary (Last 24 hours) at 05/15/16 0858 Last data filed at 05/15/16 0755  Gross per 24 hour  Intake  80.83 ml  Output   1555 ml  Net -1474.17 ml     PHYSICAL EXAMINATION: General:  Ill appearing Neuro: sleepy, follows commands, moves all extremities HEENT:  Kyphotic, Bipap mask on Cardiovascular:  Regular, tachycardic Lungs:  Poor air movement, b/l crackles, no wheeze, + accessory use  Abdomen:  Soft, non tender, decreased BS Musculoskeletal:  1+ edema Skin:  Venous stasis changes on legs b/l  LABS:  BMET  Recent Labs Lab 05/14/16 1132 05/15/16 0349  NA 142 143  K 4.2 3.8  CL 91* 91*  CO2 42* 42*  BUN 25* 24*  CREATININE 1.27* 1.16  GLUCOSE 104* 75    Electrolytes  Recent Labs Lab 05/14/16 1132 05/15/16 0349  CALCIUM 10.2 9.7    CBC  Recent Labs Lab  05/14/16 1132 05/15/16 0349  WBC 4.0 3.2*  HGB 9.9* 8.5*  HCT 31.9* 27.3*  PLT 171 160    Coag's No results for input(s): APTT, INR in the last 168 hours.  Sepsis Markers No results for input(s): LATICACIDVEN, PROCALCITON, O2SATVEN in the last 168 hours.  ABG  Recent Labs Lab 05/14/16 1210 05/14/16 2035  PHART 7.213* 7.329*  PCO2ART ABOVE REPORTABLE RANRANGE 84.0*  PO2ART 123* 72.5*    Liver Enzymes  Recent Labs Lab 05/14/16 1132  AST 21  ALT 11*  ALKPHOS 68  BILITOT 0.9  ALBUMIN 4.1    Cardiac Enzymes  Recent Labs Lab 05/14/16 1132  TROPONINI 0.03    Glucose No results for input(s): GLUCAP in the last 168 hours.  Imaging Dg Chest Port 1 View  05/15/2016  CLINICAL DATA:  Respiratory failure, CHF EXAM: PORTABLE CHEST 1 VIEW COMPARISON:  05/14/2016 FINDINGS: Continued marked elevation of the left hemidiaphragm. Diffuse bilateral airspace disease again noted with slight improvement. Suspect mild cardiomegaly. No visible effusions or acute bony abnormality. IMPRESSION: Continued low lung volumes with marked elevation of the left hemidiaphragm. Slight improvement in diffuse bilateral airspace disease. Electronically Signed   By: Rolm Baptise M.D.   On: 05/15/2016 07:14   Dg Chest Port 1 View  05/14/2016  CLINICAL DATA:  Fall during the  night.  COPD. EXAM: PORTABLE CHEST 1 VIEW COMPARISON:  03/21/2016 FINDINGS: Marked elevation of the left hemidiaphragm, progressed since prior study. Diffuse airspace opacities with cardiomegaly and vascular congestion. Findings likely reflect mild edema/ CHF. No visible effusions or pneumothorax. No acute bony abnormality. IMPRESSION: Marked elevation of the left hemidiaphragm. Diffuse bilateral airspace opacities with cardiomegaly and vascular congestion. Findings likely reflect CHF. Electronically Signed   By: Rolm Baptise M.D.   On: 05/14/2016 10:57     STUDIES:   CULTURES:  ANTIBIOTICS:  SIGNIFICANT EVENTS: 6/04  Admit  LINES/TUBES:  DISCUSSION: 70 yo male with acute encephalopathy from acute on chronic hypoxic/hypercapnic respiratory failure.  He has hx of pulmonary fibrosis, COPD, Lt diaphragm paralysis, and unspecified CHF. His response to + pressure and diuresis would support heart failure as the driving cause of his decompensation. He is still somnolent, this certainly could be persistent hypercarbia. For today plan will be to continue current plan of care: PRN NIPPV, aggressive diuresis and supplemental O2. I would minimize ABGs as nothing else to add.    ASSESSMENT / PLAN:  Acute on chronic hypoxic/hypercapnic respiratory failure. Hx of pulmonary fibrosis, COPD, Lt diaphragm paralysis. ->given response to diuretics and positive pressure would favor element of heart failure as driving acute symptoms.  Plan:   Oxygen to keep SpO2 88 to 95% BiPAP qhs and prn during the day Prn BDs Uncertain whether he has been taking mycophenolate >> hold for now  Acute encephalopathy 2nd to respiratory failure >> improved with BiPAP ->oriented but sleepy this am. Suspect he is still hypercapnic  Plan:   Monitor mental status BIPAP PRN  Reported hx of CHF >> uncertain whether systolic/diastolic; suspect he has component of cor pulmonale. Hx of CAD, HTN, HLD. Plan:  Lasix 40 mg bid (continue for 6/5) Monitor hemodynamics Zocor Hold outpt cozaar  CKD stage 2. Plan:   Monitor renal fx, urine outpt   Anemia of critical illness and chronic disease. Plan:  F/u CBC Lovenox for DVT prevention     Goals of Care >> DNR/DNI.  Disposition >> he has been residing at independent living center >> he might need higher level of care after discharge.  Erick Colace ACNP-BC [place] Pager # [phone] OR # [phone] if no answer

## 2016-05-16 DIAGNOSIS — R531 Weakness: Secondary | ICD-10-CM | POA: Insufficient documentation

## 2016-05-16 LAB — BASIC METABOLIC PANEL
ANION GAP: 6 (ref 5–15)
BUN: 21 mg/dL — ABNORMAL HIGH (ref 6–20)
CO2: 48 mmol/L — ABNORMAL HIGH (ref 22–32)
Calcium: 9.1 mg/dL (ref 8.9–10.3)
Chloride: 85 mmol/L — ABNORMAL LOW (ref 101–111)
Creatinine, Ser: 1.06 mg/dL (ref 0.61–1.24)
GFR calc Af Amer: >60 mL/min (ref >60)
Glucose, Bld: 125 mg/dL — ABNORMAL HIGH (ref 65–99)
POTASSIUM: 3.5 mmol/L (ref 3.5–5.1)
SODIUM: 139 mmol/L (ref 135–145)

## 2016-05-16 LAB — CBC
HCT: 27.9 % — ABNORMAL LOW (ref 39.0–52.0)
HEMOGLOBIN: 8.6 g/dL — AB (ref 13.0–17.0)
MCH: 28.9 pg (ref 26.0–34.0)
MCHC: 30.8 g/dL (ref 30.0–36.0)
MCV: 93.6 fL (ref 78.0–100.0)
PLATELETS: 156 10*3/uL (ref 150–400)
RBC: 2.98 MIL/uL — AB (ref 4.22–5.81)
RDW: 14.4 % (ref 11.5–15.5)
WBC: 3.9 10*3/uL — AB (ref 4.0–10.5)

## 2016-05-16 MED ORDER — POTASSIUM CHLORIDE CRYS ER 20 MEQ PO TBCR
40.0000 meq | EXTENDED_RELEASE_TABLET | Freq: Two times a day (BID) | ORAL | Status: DC
  Administered 2016-05-16 – 2016-05-18 (×5): 40 meq via ORAL
  Filled 2016-05-16 (×5): qty 2

## 2016-05-16 NOTE — Procedures (Signed)
RT called to patient bedside due to patient being uncomfortable with BIPAP black box mask.  The mask does not seal well on the patient's face despite repeated repositioning.  Removed patient from BIPAP and placed on 5L [place]. Patient asked to stay on [place] for a little while before trying a diffierent mask. Rt to continue to monitor.

## 2016-05-16 NOTE — Procedures (Signed)
Placed patient on NIV/PC BiPAP through vent. Patient complaining about mask being uncomfortable.  Tried a different size mask but despite multiple attempts to reposition and tighten, patient was not able to tolerate. Placed patient on 5L [place].  Patient is alert, responsive and resting comfortably on [place].  RN aware and RT will continue to monitor.

## 2016-05-16 NOTE — Progress Notes (Signed)
Pt is from [facility]. A higher level of care may be needed at d/c. When medically appropriate, PT eval will be needed to help determine level of care. CSW is available, as needed, to assist with d/c planning needs.  Werner Lean LCSW [phone]

## 2016-05-16 NOTE — Progress Notes (Signed)
Pt states he will have RN contact RT when he is ready for BiPAP QHS.  RT will continue to monitor as needed

## 2016-05-17 DIAGNOSIS — I5031 Acute diastolic (congestive) heart failure: Principal | ICD-10-CM

## 2016-05-17 DIAGNOSIS — J449 Chronic obstructive pulmonary disease, unspecified: Secondary | ICD-10-CM

## 2016-05-17 DIAGNOSIS — G9340 Encephalopathy, unspecified: Secondary | ICD-10-CM

## 2016-05-17 DIAGNOSIS — R531 Weakness: Secondary | ICD-10-CM

## 2016-05-17 LAB — CBC
HCT: 28 % — ABNORMAL LOW (ref 39.0–52.0)
Hemoglobin: 8.9 g/dL — ABNORMAL LOW (ref 13.0–17.0)
MCH: 29 pg (ref 26.0–34.0)
MCHC: 31.8 g/dL (ref 30.0–36.0)
MCV: 91.2 fL (ref 78.0–100.0)
PLATELETS: 149 10*3/uL — AB (ref 150–400)
RBC: 3.07 MIL/uL — ABNORMAL LOW (ref 4.22–5.81)
RDW: 14.4 % (ref 11.5–15.5)
WBC: 4.2 10*3/uL (ref 4.0–10.5)

## 2016-05-17 LAB — BASIC METABOLIC PANEL
Anion gap: 12 (ref 5–15)
BUN: 19 mg/dL (ref 6–20)
CALCIUM: 9.3 mg/dL (ref 8.9–10.3)
CHLORIDE: 85 mmol/L — AB (ref 101–111)
CO2: 39 mmol/L — ABNORMAL HIGH (ref 22–32)
CREATININE: 1.24 mg/dL (ref 0.61–1.24)
GFR calc non Af Amer: 58 mL/min — ABNORMAL LOW (ref >60)
Glucose, Bld: 139 mg/dL — ABNORMAL HIGH (ref 65–99)
Potassium: 3.9 mmol/L (ref 3.5–5.1)
SODIUM: 136 mmol/L (ref 135–145)

## 2016-05-17 MED ORDER — ENSURE ENLIVE PO LIQD: 237.0000 mL | Freq: Two times a day (BID) | ORAL | Status: AC

## 2016-05-17 MED ORDER — ALUM & MAG HYDROXIDE-SIMETH 200-200-20 MG/5ML PO SUSP
30.0000 mL | Freq: Four times a day (QID) | ORAL | Status: DC | PRN
  Administered 2016-05-18: 30 mL via ORAL
  Filled 2016-05-17: qty 30

## 2016-05-17 MED ORDER — BUMETANIDE 1 MG PO TABS: 1.5000 mg | ORAL_TABLET | Freq: Every day | ORAL | Status: AC

## 2016-05-17 MED ORDER — POTASSIUM CHLORIDE ER 10 MEQ PO TBCR: 20.0000 meq | EXTENDED_RELEASE_TABLET | Freq: Every day | ORAL | Status: AC

## 2016-05-17 NOTE — Care Management Note (Signed)
Case Management Note  Patient Details  Name: Kail Visconti MRN: [id number] Date of Birth: 1946/09/06  Subjective/Objective: Per PT-recc SNF. Spoke to sis in law Judy-agree to [employer], [place], [address]-informed CSW [place] of preference & to fax out.                   Action/Plan:d/c plan SNF.   Expected Discharge Date:   (UNKNOWN)               Expected Discharge Plan:  Skilled Nursing Facility  In-House Referral:  NA, Clinical Social Work  Discharge planning Services  CM Consult  Post Acute Care Choice:   (From [place]) Choice offered to:     DME Arranged:  N/A DME Agency:  NA  HH Arranged:  NA HH Agency:  NA  Status of Service:  In process, will continue to follow  Medicare Important Message Given:  Yes Date Medicare IM Given:    Medicare IM give by:    Date Additional Medicare IM Given:    Additional Medicare Important Message give by:     If discussed at [place] of Stay Meetings, dates discussed:    Additional Comments:  Dessa Phi, RN 05/17/2016, 2:48 PM

## 2016-05-17 NOTE — Evaluation (Signed)
`Physical Therapy Evaluation Patient Details Name: Joshua Conrad MRN: [id number] DOB: [birth year] Today's Date: 05/17/2016   History of Present Illness  Joshua Conrad is a 70 y.o. male with a past medical history significant for  pulmonary fibrosis, COPD, Lt diaphragm paralysis, OSA , CHF , and CAD who presents 05/14/16 with decreased mental status, found down by staff at [facility].   Clinical Impression  The patient presents with confusion, stating he was being moved, that his ozxygen was in another room. The patient is very SOB with  Activity and tolerated sitting and standing at the bedside with 2 assist for safety. The patient comes from [place] living and  Currently will require 24/7 assistance for mobility and safety. . Pt admitted with above diagnosis. Pt currently with functional limitations due to the deficits listed below (see PT Problem List).  Pt will benefit from skilled PT to increase their independence and safety with mobility to allow discharge to the venue listed below.       Follow Up Recommendations SNF;Supervision/Assistance - 24 hour    Equipment Recommendations  None recommended by PT    Recommendations for Other Services       Precautions / Restrictions Precautions Precautions: Fall Precaution Comments: on home Oxygen      Mobility  Bed Mobility Overal bed mobility: Needs Assistance Bed Mobility: Supine to Sit;Sit to Supine     Supine to sit: Mod assist Sit to supine: Mod assist   General bed mobility comments: assist with trunk and legs  Transfers Overall transfer level: Needs assistance Equipment used: Rolling walker (2 wheeled) Transfers: Sit to/from Stand Sit to Stand: Mod assist;+2 safety/equipment;From elevated surface         General transfer comment: cues for hand placement, cues  for safety  Ambulation/Gait Ambulation/Gait assistance: Mod assist;+2 safety/equipment           General Gait Details: side steps x 4 along bed only,  very SOB on 5 liters.   Stairs            Wheelchair Mobility    Modified Rankin (Stroke Patients Only)       Balance Overall balance assessment: Needs assistance;History of Falls         Standing balance support: During functional activity;Bilateral upper extremity supported Standing balance-Leahy Scale: Poor                               Pertinent Vitals/Pain Pain Assessment: Faces Faces Pain Scale: Hurts little more Pain Location: general Pain Descriptors / Indicators: Aching Pain Intervention(s): Limited activity within patient's tolerance;Monitored during session    Home Living                   Additional Comments: from [place] [facility]    Prior Function Level of Independence: Independent         Comments: uncertain of caregivers available, patient unable to provide, came from independent living, patien was confused and unable to give full details.     Hand Dominance        Extremity/Trunk Assessment   Upper Extremity Assessment: Generalized weakness           Lower Extremity Assessment: Generalized weakness      Cervical / Trunk Assessment: Kyphotic  Communication   Communication:  (confused conversation)  Cognition Arousal/Alertness: Awake/alert Behavior During Therapy: Anxious;Flat affect Overall Cognitive Status: No family/caregiver present to determine baseline cognitive functioning Area of Impairment: Orientation;Memory;Safety/judgement;Awareness  Orientation Level: Time;Situation   Memory: Decreased short-term memory         General Comments: thought he was moving to a new room    General Comments      Exercises        Assessment/Plan    PT Assessment Patient needs continued PT services  PT Diagnosis Difficulty walking;Generalized weakness;Altered mental status   PT Problem List Decreased strength;Decreased activity tolerance;Decreased balance;Decreased mobility;Decreased  cognition;Cardiopulmonary status limiting activity;Decreased knowledge of precautions;Decreased safety awareness  PT Treatment Interventions DME instruction;Gait training;Therapeutic activities;Functional mobility training;Therapeutic exercise;Patient/family education   PT Goals (Current goals can be found in the Care Plan section) Acute Rehab PT Goals Patient Stated Goal: I want all surfaces to be level PT Goal Formulation: Patient unable to participate in goal setting Time For Goal Achievement: 05/31/16 Potential to Achieve Goals: Fair    Frequency Min 3X/week   Barriers to discharge Decreased caregiver support      Co-evaluation               End of Session Equipment Utilized During Treatment: Gait belt Activity Tolerance: Patient limited by fatigue Patient left: in bed;with call bell/phone within reach;with bed alarm set Nurse Communication: Mobility status         Time: [id number] PT Time Calculation (min) (ACUTE ONLY): 29 min   Charges:   PT Evaluation $PT Eval Low Complexity: 1 Procedure PT Treatments $Therapeutic Activity: 8-22 mins   PT G Codes:        Joshua Conrad 05/17/2016, 3:45 PM Joshua Conrad PT [phone]

## 2016-05-17 NOTE — Progress Notes (Signed)
Pt has refused BIPAP for the night, Pt to notify RT if he changes his mind.  RT to monitor and assess as needed.

## 2016-05-17 NOTE — Discharge Summary (Signed)
PATIENT DETAILS Name: Joshua Conrad Age: 70 y.o. Sex: male Date of Birth: 1946-08-02 MRN: [id number]. Admitting Physician: Marshell Garfinkel, MD PCP:Yun Nancy Fetter, MD  Admit Date: 05/14/2016 Discharge date: 05/17/2016  Recommendations for Outpatient Follow-up:  1. Please ensure follow up with Pulmonologist at [facility] 2. Daily weights, follow electrolytes closely 3. BIPAP QHS 4. Please repeat CBC/BMET in 1 week.  PRIMARY DISCHARGE DIAGNOSIS:  Principal Problem:   Acute on chronic respiratory failure (HCC) Active Problems:   COPD (chronic obstructive pulmonary disease) (HCC)   CAD (coronary artery disease)   OSA (obstructive sleep apnea)   CHF (congestive heart failure) (HCC)   Confusion   Acute on chronic respiratory failure with hypoxia and hypercapnia (HCC)   Generalized weakness      PAST MEDICAL HISTORY: Past Medical History  Diagnosis Date  . Pulmonary fibrosis ([place])   . MI (mitral incompetence)   . Hypertension   . Hyperlipidemia   . Coronary artery disease   . CHF (congestive heart failure) ([place])   . COPD (chronic obstructive pulmonary disease) ([place])   . 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 OSA (obstructive sleep apnea)   . Diaphragm paralysis     Left hemidiaphragm    DISCHARGE MEDICATIONS: Current Discharge Medication List    START taking these medications   Details  feeding supplement, ENSURE ENLIVE, (ENSURE ENLIVE) LIQD Take 237 mLs by mouth 2 (two) times daily between meals.      CONTINUE these medications which have CHANGED   Details  bumetanide (BUMEX) 1 MG tablet Take 1.5 tablets (1.5 mg total) by mouth daily.    potassium chloride (K-DUR) 10 MEQ tablet Take 2 tablets (20 mEq total) by mouth daily.      CONTINUE these medications which have NOT CHANGED   Details  albuterol (PROVENTIL HFA;VENTOLIN HFA) 108 (90 BASE) MCG/ACT inhaler Inhale 2 puffs into the lungs every 6 (six) hours as needed for wheezing or shortness of breath.    albuterol (PROVENTIL) (2.5 MG/3ML)  0.083% nebulizer solution Take 2.5 mg by nebulization every 4 (four) hours as needed for wheezing or shortness of breath.    allopurinol (ZYLOPRIM) 300 MG tablet Take 300 mg by mouth daily.    cholecalciferol (VITAMIN D) 400 UNITS TABS tablet Take 400 Units by mouth daily.    ferrous sulfate 325 (65 FE) MG tablet Take 325 mg by mouth 2 (two) times daily.    fluticasone (FLONASE) 50 MCG/ACT nasal spray Place 2 sprays into both nostrils at bedtime.    losartan (COZAAR) 50 MG tablet Take 50 mg by mouth daily.    montelukast (SINGULAIR) 10 MG tablet Take 10 mg by mouth at bedtime.    mycophenolate (CELLCEPT) 500 MG tablet Take 1,500 mg by mouth 2 (two) times daily.    OXYGEN Inhale 4 L into the lungs continuous.    ranitidine (ZANTAC) 150 MG tablet Take 150 mg by mouth 2 (two) times daily.    simvastatin (ZOCOR) 5 MG tablet Take 5 mg by mouth at bedtime.    vitamin C (ASCORBIC ACID) 500 MG tablet Take 500 mg by mouth daily.        ALLERGIES:   Allergies  Allergen Reactions  . Prednisone     Caused pt to go crazy     BRIEF HPI:  See H&P, Labs, Consult and Test reports for all details in brief, Joshua Conrad is a 70 y.o. male with a past medical history significant for pulmonary fibrosis treated at [facility] with MMF, COPD,  Chronic resp failure on 4L O2, OSA, on home BIPAP, CHF unclear EF, and CAD who presented with decreased mental status over days to weeks.  CONSULTATIONS:   pulmonary/intensive care  PERTINENT RADIOLOGIC STUDIES: Dg Chest Port 1 View  05/15/2016  CLINICAL DATA:  Respiratory failure, CHF EXAM: PORTABLE CHEST 1 VIEW COMPARISON:  05/14/2016 FINDINGS: Continued marked elevation of the left hemidiaphragm. Diffuse bilateral airspace disease again noted with slight improvement. Suspect mild cardiomegaly. No visible effusions or acute bony abnormality. IMPRESSION: Continued low lung volumes with marked elevation of the left hemidiaphragm. Slight improvement in diffuse bilateral  airspace disease. Electronically Signed   By: Rolm Baptise M.D.   On: 05/15/2016 07:14   Dg Chest Port 1 View  05/14/2016  CLINICAL DATA:  Fall during the night.  COPD. EXAM: PORTABLE CHEST 1 VIEW COMPARISON:  03/21/2016 FINDINGS: Marked elevation of the left hemidiaphragm, progressed since prior study. Diffuse airspace opacities with cardiomegaly and vascular congestion. Findings likely reflect mild edema/ CHF. No visible effusions or pneumothorax. No acute bony abnormality. IMPRESSION: Marked elevation of the left hemidiaphragm. Diffuse bilateral airspace opacities with cardiomegaly and vascular congestion. Findings likely reflect CHF. Electronically Signed   By: Rolm Baptise M.D.   On: 05/14/2016 10:57     PERTINENT LAB RESULTS: CBC:  Recent Labs  05/16/16 0339 05/17/16 0523  WBC 3.9* 4.2  HGB 8.6* 8.9*  HCT 27.9* 28.0*  PLT 156 149*   CMET CMP     Component Value Date/Time   NA 136 05/17/2016 0523   K 3.9 05/17/2016 0523   CL 85* 05/17/2016 0523   CO2 39* 05/17/2016 0523   GLUCOSE 139* 05/17/2016 0523   BUN 19 05/17/2016 0523   CREATININE 1.24 05/17/2016 0523   CALCIUM 9.3 05/17/2016 0523   PROT 6.8 05/14/2016 1132   ALBUMIN 4.1 05/14/2016 1132   AST 21 05/14/2016 1132   ALT 11* 05/14/2016 1132   ALKPHOS 68 05/14/2016 1132   BILITOT 0.9 05/14/2016 1132   GFRNONAA 58* 05/17/2016 0523   GFRAA >60 05/17/2016 0523    GFR Estimated Creatinine Clearance: 65.4 mL/min (by C-G formula based on Cr of 1.24). No results for input(s): LIPASE, AMYLASE in the last 72 hours. No results for input(s): CKTOTAL, CKMB, CKMBINDEX, TROPONINI in the last 72 hours. Invalid input(s): POCBNP No results for input(s): DDIMER in the last 72 hours. No results for input(s): HGBA1C in the last 72 hours. No results for input(s): CHOL, HDL, LDLCALC, TRIG, CHOLHDL, LDLDIRECT in the last 72 hours. No results for input(s): TSH, T4TOTAL, T3FREE, THYROIDAB in the last 72 hours.  Invalid input(s):  FREET3 No results for input(s): VITAMINB12, FOLATE, FERRITIN, TIBC, IRON, RETICCTPCT in the last 72 hours. Coags: No results for input(s): INR in the last 72 hours.  Invalid input(s): PT Microbiology: Recent Results (from the past 240 hour(s))  MRSA PCR Screening     Status: None   Collection Time: 05/14/16  5:39 PM  Result Value Ref Range Status   MRSA by PCR NEGATIVE NEGATIVE Final    Comment:        The GeneXpert MRSA Assay (FDA approved for NASAL specimens only), is one component of a comprehensive MRSA colonization surveillance program. It is not intended to diagnose MRSA infection nor to guide or monitor treatment for MRSA infections.      BRIEF HOSPITAL COURSE:   Principal Problem: Acute on chronic hypoxic/hypercapnic respiratory failure.:Acute hypoxic/hypercarbic resp failure thought to be secondary to acute diastolic CHF, with underlying pulmonary fibrosis,  left diaphragmatic paralysis playing a role as well. Much better with Bipap and IV Lasix.Now feels close to his usual baseline. Appears very comfortable, on 5 L of O2 currently-at baseline on 5-6 L of O2 at home. Stable to be discharged to SNF today. Continue O2 supplementation and BiPAP QHS (BIPAP 12/6).He unfortunately has not had follow up at [facility] for close to 3 years now, I have asked him to make sure he follows with his Pulmonologist there.   Active Problems: Acute Encephalopathy:likely metabolic-due to above. Resolved-awake and alert at time of discharge.  Acute Diastolic CHF: compensated, neg 4 L balance with IV Lasix. Will transition back to Bumex on discharge, but will increase dose to 1.5 mg daily. Follow weights, and electrolytes closely.   Lt diaphragm paralysis:BiPAP qhs   HTN: stable,resume losartan and bumetanide on discharge  COPD: Continue Duoneb PRN.Continue Singulair, Pepcid  Pulmonary fibrosis:continue Mycophenolate, please ensure follow up at [facility]  Chronic anemia:close to previous levels,  could be related to Mycophenolate. Please continue to follow closely while at [facility].Anemia panel in 11/16 was c/w chronic disease  H/o Kaposi sarcoma:treated with XRT  Gout: continue Allopurinol  Deconditioning/Gen weakness:secondary to acute on chronic disease, will require PT follow up at SNF  Severe malnutrition in context of chronic illness:continue supplements  Code Status: DNR/DNI  Note-spoke with sister in law -Francena Hanly over the phone-outlined above plan of care. Social worker to speak with family regarding placement issues.  TODAY-DAY OF DISCHARGE:  Subjective:   Marlone Paradiso today has no headache,no chest abdominal pain,no new weakness tingling or numbness, feels much better  Objective:   Blood pressure 121/75, pulse 85, temperature 98.1 F (36.7 C), temperature source Oral, resp. rate 20, height 6\' 2"  (1.88 m), weight 86.864 kg (191 lb 8 oz), SpO2 98 %.  Intake/Output Summary (Last 24 hours) at 05/17/16 1143 Last data filed at 05/17/16 0900  Gross per 24 hour  Intake    130 ml  Output   1300 ml  Net  -1170 ml   Filed Weights   05/15/16 0447 05/16/16 0418 05/17/16 0500  Weight: 86.7 kg (191 lb 2.2 oz) 85.5 kg (188 lb 7.9 oz) 86.864 kg (191 lb 8 oz)    Exam Awake Alert, Oriented *3, No new F.N deficits, Normal affect. Chronically sick appearing. But not in any distress. [place].AT,PERRAL Supple Neck,No JVD, No cervical lymphadenopathy appriciated.  Symmetrical Chest wall movement, Good air movement bilaterally, CTAB-but has some inspiratory rales. RRR,No Gallops,Rubs or new Murmurs, No Parasternal Heave +ve B.Sounds, Abd Soft, Non tender, No organomegaly appriciated, No rebound -guarding or rigidity. No Cyanosis, Clubbing or edema, No new Rash or bruise  DISCHARGE CONDITION: Stable  DISPOSITION: SNF  DISCHARGE INSTRUCTIONS:    Activity:  As tolerated with Full fall precautions use walker/cane & assistance as needed  Get Medicines reviewed and  adjusted: Please take all your medications with you for your next visit with your Primary MD  Please request your Primary MD to go over all hospital tests and procedure/radiological results at the follow up, please ask your Primary MD to get all Hospital records sent to his/her office.  If you experience worsening of your admission symptoms, develop shortness of breath, life threatening emergency, suicidal or homicidal thoughts you must seek medical attention immediately by calling 911 or calling your MD immediately  if symptoms less severe.  You must read complete instructions/literature along with all the possible adverse reactions/side effects for all the Medicines you take and that have been  prescribed to you. Take any new Medicines after you have completely understood and accpet all the possible adverse reactions/side effects.   Do not drive when taking Pain medications.   Do not take more than prescribed Pain, Sleep and Anxiety Medications  Special Instructions: If you have smoked or chewed Tobacco  in the last 2 yrs please stop smoking, stop any regular Alcohol  and or any Recreational drug use.  Wear Seat belts while driving.  Please note  You were cared for by a hospitalist during your hospital stay. Once you are discharged, your primary care physician will handle any further medical issues. Please note that NO REFILLS for any discharge medications will be authorized once you are discharged, as it is imperative that you return to your primary care physician (or establish a relationship with a primary care physician if you do not have one) for your aftercare needs so that they can reassess your need for medications and monitor your lab values.   Diet recommendation: Heart Healthy diet  Discharge Instructions    (HEART FAILURE PATIENTS) Call MD:  Anytime you have any of the following symptoms: 1) 3 pound weight gain in 24 hours or 5 pounds in 1 week 2) shortness of breath, with or  without a dry hacking cough 3) swelling in the hands, feet or stomach 4) if you have to sleep on extra pillows at night in order to breathe.    Complete by:  As directed      Diet - low sodium heart healthy    Complete by:  As directed      Discharge instructions    Complete by:  As directed   USE BIPAP QHS     Increase activity slowly    Complete by:  As directed            Follow-up Information    Follow up with Sandi Mariscal, MD. Schedule an appointment as soon as possible for a visit in 2 weeks.   Specialty:  Internal Medicine   Contact information:   [place] [address] [phone]       Schedule an appointment as soon as possible for a visit in 2 weeks to follow up.   Why:  Hospital follow up   Contact information:   Pulmonologist at [facility]      Total Time spent on discharge equals 45 minutes.   SignedOren Binet 05/17/2016 11:43 AM

## 2016-05-17 NOTE — Care Management Important Message (Signed)
Important Message  Patient Details  Name: Joshua Conrad MRN: [id number] Date of Birth: [birth year]   Medicare Important Message Given:  Yes    Camillo Flaming 05/17/2016, 10:23 AMImportant Message  Patient Details  Name: Joshua Conrad MRN: [id number] Date of Birth: 05-20-1946   Medicare Important Message Given:  Yes    Camillo Flaming 05/17/2016, 10:22 AM

## 2016-05-18 ENCOUNTER — Institutional Professional Consult (permissible substitution): Payer: Medicare Other | Admitting: Pulmonary Disease

## 2016-05-18 MED ORDER — FUROSEMIDE 10 MG/ML IJ SOLN
40.0000 mg | Freq: Every day | INTRAMUSCULAR | Status: DC
  Administered 2016-05-18: 40 mg via INTRAVENOUS
  Filled 2016-05-18: qty 4

## 2016-05-18 MED ORDER — ACETAMINOPHEN 325 MG PO TABS
650.0000 mg | ORAL_TABLET | Freq: Four times a day (QID) | ORAL | Status: DC | PRN
  Administered 2016-05-18: 650 mg via ORAL
  Filled 2016-05-18: qty 2

## 2016-05-18 NOTE — Progress Notes (Signed)
Seen and examined Vitals-stable-on 5-6 L of O2 (his home regimen) Claims he feels better-breathing better.He claims his main issue is "weakness" - 6.5 L so far Weight stable at 191 Lungs:+inspiratory rales-but otherwise clear [id number] edema  See d/c summary from yesterday for details-no change to medications, or plan as outlined  Main issue now is deconditioning, he remains stable for d/c to SNF today.

## 2016-05-18 NOTE — Care Management Note (Signed)
Case Management Note  Patient Details  Name: Joshua Conrad MRN: [id number] Date of Birth: 08/21/1946  Subjective/Objective:                    Action/Plan:d/c SNF.   Expected Discharge Date:   (UNKNOWN)               Expected Discharge Plan:  Skilled Nursing Facility  In-House Referral:  NA, Clinical Social Work  Discharge planning Services  CM Consult  Post Acute Care Choice:   (From [place]) Choice offered to:     DME Arranged:  N/A DME Agency:  NA  HH Arranged:  NA HH Agency:  NA  Status of Service:  Completed, signed off  Medicare Important Message Given:  Yes Date Medicare IM Given:    Medicare IM give by:    Date Additional Medicare IM Given:    Additional Medicare Important Message give by:     If discussed at [place] of Stay Meetings, dates discussed:    Additional Comments:  Dessa Phi, RN 05/18/2016, 10:43 AM

## 2016-05-18 NOTE — Clinical Social Work Placement (Signed)
Patient is set to discharge to [facility] today. Patient & sister-in-law, Joshua Conrad made aware. Discharge packet given to RN, Susie. [facility] called for transport to pickup at 2:00pm.     Raynaldo Opitz, [place] Worker cell #: [phone]    CLINICAL SOCIAL WORK PLACEMENT  NOTE  Date:  05/18/2016  Patient Details  Name: Joshua Conrad MRN: [id number] Date of Birth: 02-13-1946  Clinical Social Work is seeking post-discharge placement for this patient at the [place] level of care (*CSW will initial, date and re-position this form in  chart as items are completed):  Yes   Patient/family provided with [place] Work Department's list of facilities offering this level of care within the geographic area requested by the patient (or if unable, by the patient's family).  Yes   Patient/family informed of their freedom to choose among providers that offer the needed level of care, that participate in Medicare, Medicaid or managed care program needed by the patient, have an available bed and are willing to accept the patient.  Yes   Patient/family informed of [place]'s ownership interest in [facility] and [facility], as well as of the fact that they are under no obligation to receive care at these facilities.  PASRR submitted to EDS on       PASRR number received on       Existing PASRR number confirmed on 05/18/16     FL2 transmitted to all facilities in geographic area requested by pt/family on 05/18/16     FL2 transmitted to all facilities within larger geographic area on       Patient informed that his/her managed care company has contracts with or will negotiate with certain facilities, including the following:        Yes   Patient/family informed of bed offers received.  Patient chooses bed at [facility]     Physician recommends and patient chooses bed at      Patient to be  transferred to [facility] on 05/18/16.  Patient to be transferred to facility by [facility]     Patient family notified on 05/18/16 of transfer.  Name of family member notified:  patient's sister-in-law, Joshua Conrad via phone     PHYSICIAN       Additional Comment:    _______________________________________________ Standley Brooking, LCSW 05/18/2016, 9:38 AM

## 2016-05-18 NOTE — Clinical Social Work Note (Signed)
Clinical Social Work Assessment  Patient Details  Name: Joshua Conrad MRN: [id number] Date of Birth: Apr 04, 1946  Date of referral:  05/18/16               Reason for consult:  Facility Placement                Permission sought to share information with:  Case Manager Permission granted to share information::     Name::        Agency::     Relationship::     Contact Information:     Housing/Transportation Living arrangements for the past 2 months:  [place], [place] of Information:  Patient, Other (Comment Required) (Sister in law-Joshua Conrad) Patient Interpreter Needed:  None Criminal Activity/Legal Involvement Pertinent to Current Situation/Hospitalization:  No - Comment as needed Significant Relationships:  Other Family Members Lives with:  Facility Resident Do you feel safe going back to the place where you live?  No Need for family participation in patient care:  Yes (Comment)  Care giving concerns:  Patient sister in law is involved in patient care. She expresses the patient needs a higher level of care due his current inability to do things on his own. She is the only family he has left. Patient lived at [place] for few weeks prior to coming to hospital. Patient expresses he will need extra support at well.    Social Worker assessment / plan: LCSWA met with patient at bedside and spoke with patient sister in law via phone. LCSWA explained role and reason for consult. Both expressed the need for SNF at this recommended by medical staff. Patient sister in law expressed patient nervousness and fear about going to certain facilities.   Plan: LCSWA will support patient and family during disposition to SNF.  Employment status:  Retired Forensic scientist:  Medicare PT Recommendations:  [place] / Referral to community resources:  [place]  Patient/Family's Response to care:   Agreeable  Patient/Family's Understanding of and Emotional Response to Diagnosis, Current Treatment, and Prognosis: Patient is nervous about SNF facility but is willing to go for care and support. Patient sister in law is involved in patient current treatment and care. Both are agreeable to higher level of care at this time.   Emotional Assessment Appearance:  Appears stated age Attitude/Demeanor/Rapport:   (Cooperative) Affect (typically observed):  Accepting, Pleasant Orientation:  Oriented to Self, Oriented to Place, Oriented to Situation Alcohol / Substance use:  Not Applicable Psych involvement (Current and /or in the community):  No (Comment)  Discharge Needs  Concerns to be addressed:  No discharge needs identified Readmission within the last 30 days:  No Current discharge risk:  None Barriers to Discharge:  No Barriers Identified   Lia Hopping, LCSW 05/18/2016, 8:15 AM

## 2016-05-18 NOTE — Progress Notes (Signed)
Called report to [facility] and placed on hold for 6 mins, 39 seconds. Left message for nurse to call me back.

## 2016-05-18 NOTE — NC FL2 (Signed)
[place] LEVEL OF CARE SCREENING TOOL     IDENTIFICATION  Patient Name: Joshua Conrad Birthdate: [birth year] Sex: male Admission Date (Current Location): 05/14/2016  [facility] and [place] Number:  Herbalist and Address:  [facility],  [place] [address] [place]      Provider Number: [id number]  Attending Physician Name and Address:  Jonetta Osgood, MD  Relative Name and Phone Number:       Current Level of Care:   Recommended Level of Care: [place] Prior Approval Number:    Date Approved/Denied:   PASRR Number:   [id number]  Discharge Plan: SNF    Current Diagnoses: Patient Active Problem List   Diagnosis Date Noted  . Generalized weakness   . Acute on chronic respiratory failure with hypoxia and hypercapnia (HCC)   . Acute on chronic respiratory failure ([place]) 05/14/2016  . Confusion 05/14/2016  . Acute delirium 11/02/2015  . COPD (chronic obstructive pulmonary disease) ([place]) 11/02/2015  . CAD (coronary artery disease) 11/02/2015  . OSA (obstructive sleep apnea) 11/02/2015  . CHF (congestive heart failure) ([place]) 11/02/2015  . Chronic anemia 11/02/2015  . Delirium 11/01/2015    Orientation RESPIRATION BLADDER Height & Weight     Self, Situation, Place  O2 (6L) Incontinent Weight: 191 lb (86.637 kg) Height:  6\' 2"  (188 cm)  BEHAVIORAL SYMPTOMS/MOOD NEUROLOGICAL BOWEL NUTRITION STATUS      Continent Diet (Low Sodium Heart Healthy)  AMBULATORY STATUS COMMUNICATION OF NEEDS Skin   Limited Assist Verbally Normal                       Personal Care Assistance Level of Assistance  Bathing, Feeding, Dressing Bathing Assistance: Limited assistance Feeding assistance: Limited assistance Dressing Assistance: Limited assistance     Functional Limitations Info  Sight, Hearing, Speech Sight Info: Adequate Hearing Info: Adequate Speech Info: Adequate    SPECIAL CARE FACTORS FREQUENCY  PT (By  licensed PT)     PT Frequency: 5              Contractures Contractures Info: Not present    Additional Factors Info  Code Status, Allergies, Psychotropic, Insulin Sliding Scale, Isolation Precautions Code Status Info: DNR Allergies Info: Prednisone Psychotropic Info: None Insulin Sliding Scale Info: None Isolation Precautions Info: None     Current Medications (05/18/2016):  This is the current hospital active medication list Current Facility-Administered Medications  Medication Dose Route Frequency Provider Last Rate Last Dose  . 0.9 %  sodium chloride infusion   Intravenous Continuous Chesley Mires, MD   Stopped at 05/16/16 0800  . acetaminophen (TYLENOL) tablet 650 mg  650 mg Oral Q6H PRN Jonetta Osgood, MD      . allopurinol (ZYLOPRIM) tablet 300 mg  300 mg Oral Daily Chesley Mires, MD   300 mg at 05/17/16 0900  . alum & mag hydroxide-simeth (MAALOX/MYLANTA) 200-200-20 MG/5ML suspension 30 mL  30 mL Oral Q6H PRN Jonetta Osgood, MD   30 mL at 05/18/16 0304  . antiseptic oral rinse (CPC / CETYLPYRIDINIUM CHLORIDE 0.05%) solution 7 mL  7 mL Mouth Rinse q12n4p Chesley Mires, MD   7 mL at 05/17/16 1600  . chlorhexidine (PERIDEX) 0.12 % solution 15 mL  15 mL Mouth Rinse BID Chesley Mires, MD   15 mL at 05/17/16 2226  . cholecalciferol (VITAMIN D) tablet 400 Units  400 Units Oral Daily Chesley Mires, MD   400 Units at  05/17/16 1000  . enoxaparin (LOVENOX) injection 40 mg  40 mg Subcutaneous Q24H Luiz Ochoa, RPH   40 mg at 05/17/16 1600  . famotidine (PEPCID) tablet 20 mg  20 mg Oral QHS Chesley Mires, MD   20 mg at 05/17/16 2226  . feeding supplement (ENSURE ENLIVE) (ENSURE ENLIVE) liquid 237 mL  237 mL Oral BID BM Domenic Polite, MD   237 mL at 05/17/16 1400  . ferrous sulfate tablet 325 mg  325 mg Oral BID WC Chesley Mires, MD   325 mg at 05/18/16 0728  . fluticasone (FLONASE) 50 MCG/ACT nasal spray 2 spray  2 spray Each Nare QHS Chesley Mires, MD   2 spray at 05/17/16 2225  . furosemide  (LASIX) injection 40 mg  40 mg Intravenous Daily Shanker Kristeen Mans, MD      . ipratropium-albuterol (DUONEB) 0.5-2.5 (3) MG/3ML nebulizer solution 3 mL  3 mL Nebulization Q2H PRN Chesley Mires, MD   3 mL at 05/17/16 2245  . montelukast (SINGULAIR) tablet 10 mg  10 mg Oral QHS Chesley Mires, MD   10 mg at 05/17/16 2225  . mycophenolate (CELLCEPT) capsule 1,500 mg  1,500 mg Oral BID Chesley Mires, MD   1,500 mg at 05/17/16 2225  . potassium chloride SA (K-DUR,KLOR-CON) CR tablet 40 mEq  40 mEq Oral BID Domenic Polite, MD   40 mEq at 05/17/16 2226  . simvastatin (ZOCOR) tablet 5 mg  5 mg Oral QHS Chesley Mires, MD   5 mg at 05/17/16 2226  . vitamin C (ASCORBIC ACID) tablet 500 mg  500 mg Oral Daily Chesley Mires, MD   500 mg at 05/17/16 0900     Discharge Medications: Please see discharge summary for a list of discharge medications.  Relevant Imaging Results:  Relevant Lab Results:   Additional Information ss#[id number]  Standley Brooking, [place]

## 2016-06-05 ENCOUNTER — Emergency Department (HOSPITAL_COMMUNITY): Payer: Medicare Other

## 2016-06-05 ENCOUNTER — Inpatient Hospital Stay (HOSPITAL_COMMUNITY)
Admission: EM | Admit: 2016-06-05 | Discharge: 2016-06-09 | DRG: 190 | Disposition: A | Discharge status: Skilled Nursing Facility | Payer: Medicare Other | Attending: Internal Medicine | Admitting: Internal Medicine

## 2016-06-05 ENCOUNTER — Encounter (HOSPITAL_COMMUNITY): Payer: Self-pay | Admitting: Emergency Medicine

## 2016-06-05 ENCOUNTER — Other Ambulatory Visit (HOSPITAL_COMMUNITY): Payer: Self-pay

## 2016-06-05 ENCOUNTER — Other Ambulatory Visit: Payer: Self-pay

## 2016-06-05 DIAGNOSIS — J986 Disorders of diaphragm: Secondary | ICD-10-CM | POA: Diagnosis present

## 2016-06-05 DIAGNOSIS — J441 Chronic obstructive pulmonary disease with (acute) exacerbation: Secondary | ICD-10-CM | POA: Diagnosis present

## 2016-06-05 DIAGNOSIS — Z7951 Long term (current) use of inhaled steroids: Secondary | ICD-10-CM

## 2016-06-05 DIAGNOSIS — R0602 Shortness of breath: Secondary | ICD-10-CM | POA: Diagnosis not present

## 2016-06-05 DIAGNOSIS — Z8249 Family history of ischemic heart disease and other diseases of the circulatory system: Secondary | ICD-10-CM

## 2016-06-05 DIAGNOSIS — J440 Chronic obstructive pulmonary disease with acute lower respiratory infection: Secondary | ICD-10-CM | POA: Diagnosis not present

## 2016-06-05 DIAGNOSIS — D509 Iron deficiency anemia, unspecified: Secondary | ICD-10-CM | POA: Diagnosis present

## 2016-06-05 DIAGNOSIS — D61818 Other pancytopenia: Secondary | ICD-10-CM | POA: Diagnosis present

## 2016-06-05 DIAGNOSIS — Z8589 Personal history of malignant neoplasm of other organs and systems: Secondary | ICD-10-CM

## 2016-06-05 DIAGNOSIS — E785 Hyperlipidemia, unspecified: Secondary | ICD-10-CM | POA: Diagnosis present

## 2016-06-05 DIAGNOSIS — Z955 Presence of coronary angioplasty implant and graft: Secondary | ICD-10-CM

## 2016-06-05 DIAGNOSIS — Z923 Personal history of irradiation: Secondary | ICD-10-CM

## 2016-06-05 DIAGNOSIS — Z9981 Dependence on supplemental oxygen: Secondary | ICD-10-CM

## 2016-06-05 DIAGNOSIS — E43 Unspecified severe protein-calorie malnutrition: Secondary | ICD-10-CM | POA: Diagnosis present

## 2016-06-05 DIAGNOSIS — Z888 Allergy status to other drugs, medicaments and biological substances status: Secondary | ICD-10-CM

## 2016-06-05 DIAGNOSIS — I5032 Chronic diastolic (congestive) heart failure: Secondary | ICD-10-CM | POA: Diagnosis present

## 2016-06-05 DIAGNOSIS — J189 Pneumonia, unspecified organism: Secondary | ICD-10-CM | POA: Diagnosis present

## 2016-06-05 DIAGNOSIS — Z9119 Patient's noncompliance with other medical treatment and regimen: Secondary | ICD-10-CM

## 2016-06-05 DIAGNOSIS — Z79899 Other long term (current) drug therapy: Secondary | ICD-10-CM

## 2016-06-05 DIAGNOSIS — J449 Chronic obstructive pulmonary disease, unspecified: Secondary | ICD-10-CM | POA: Diagnosis present

## 2016-06-05 DIAGNOSIS — Z66 Do not resuscitate: Secondary | ICD-10-CM | POA: Diagnosis present

## 2016-06-05 DIAGNOSIS — M109 Gout, unspecified: Secondary | ICD-10-CM | POA: Diagnosis present

## 2016-06-05 DIAGNOSIS — D649 Anemia, unspecified: Secondary | ICD-10-CM | POA: Diagnosis present

## 2016-06-05 DIAGNOSIS — Y95 Nosocomial condition: Secondary | ICD-10-CM | POA: Diagnosis present

## 2016-06-05 DIAGNOSIS — J9622 Acute and chronic respiratory failure with hypercapnia: Secondary | ICD-10-CM | POA: Diagnosis present

## 2016-06-05 DIAGNOSIS — E872 Acidosis: Secondary | ICD-10-CM | POA: Diagnosis present

## 2016-06-05 DIAGNOSIS — Z6825 Body mass index (BMI) 25.0-25.9, adult: Secondary | ICD-10-CM

## 2016-06-05 DIAGNOSIS — G4733 Obstructive sleep apnea (adult) (pediatric): Secondary | ICD-10-CM | POA: Diagnosis present

## 2016-06-05 DIAGNOSIS — Z87891 Personal history of nicotine dependence: Secondary | ICD-10-CM

## 2016-06-05 DIAGNOSIS — D638 Anemia in other chronic diseases classified elsewhere: Secondary | ICD-10-CM | POA: Diagnosis present

## 2016-06-05 DIAGNOSIS — I110 Hypertensive heart disease with heart failure: Secondary | ICD-10-CM | POA: Diagnosis present

## 2016-06-05 DIAGNOSIS — J9621 Acute and chronic respiratory failure with hypoxia: Secondary | ICD-10-CM | POA: Diagnosis present

## 2016-06-05 DIAGNOSIS — F419 Anxiety disorder, unspecified: Secondary | ICD-10-CM | POA: Diagnosis present

## 2016-06-05 DIAGNOSIS — J8410 Pulmonary fibrosis, unspecified: Secondary | ICD-10-CM | POA: Diagnosis present

## 2016-06-05 DIAGNOSIS — Z515 Encounter for palliative care: Secondary | ICD-10-CM | POA: Insufficient documentation

## 2016-06-05 DIAGNOSIS — I2510 Atherosclerotic heart disease of native coronary artery without angina pectoris: Secondary | ICD-10-CM | POA: Diagnosis present

## 2016-06-05 DIAGNOSIS — R000 Tachycardia, unspecified: Secondary | ICD-10-CM | POA: Diagnosis present

## 2016-06-05 DIAGNOSIS — I340 Nonrheumatic mitral (valve) insufficiency: Secondary | ICD-10-CM | POA: Diagnosis present

## 2016-06-05 DIAGNOSIS — G9340 Encephalopathy, unspecified: Secondary | ICD-10-CM | POA: Diagnosis present

## 2016-06-05 DIAGNOSIS — R531 Weakness: Secondary | ICD-10-CM

## 2016-06-05 HISTORY — DX: Gout, unspecified: M10.9

## 2016-06-05 MED ORDER — ALBUTEROL SULFATE (2.5 MG/3ML) 0.083% IN NEBU: 5.0000 mg | INHALATION_SOLUTION | Freq: Once | RESPIRATORY_TRACT | Status: DC

## 2016-06-05 NOTE — ED Notes (Signed)
Bed: WA20 Expected date:  Expected time:  Means of arrival:  Comments: Short of breath/low O2 sat

## 2016-06-05 NOTE — ED Notes (Signed)
Provided warm blanket and dimmed lights. Updated on plan of care and the reason for waiting.

## 2016-06-05 NOTE — ED Notes (Addendum)
Per EMS patient from [place].  Reports shortness of breath.  Patient on 4L [place] at facility.  Patient received 2 nebs in route to hospital.  Patient states shortness of breath has been going on for "a while". On arrival to ER patient 77% on 5L [place].  Patient placed on NRB at 15L/min.  Oxygen levels increased to 100%

## 2016-06-05 NOTE — ED Notes (Signed)
Dr. Suzie Portela increased oxygen to 6L via [place]. Reports as long as patient's O2 sats above 88%, he is good, stable.

## 2016-06-05 NOTE — ED Provider Notes (Signed)
CSN: [id number]     Arrival date & time 06/05/16  2305 History  By signing my name below, I, Ephriam Jenkins, attest that this documentation has been prepared under the direction and in the presence of Everlene Balls, MD. Electronically signed, Ephriam Jenkins, ED Scribe. 06/05/2016. 11:50 PM.    Chief Complaint  Patient presents with  . Shortness of Breath   The history is provided by the patient. No language interpreter was used.   HPI Comments: Joshua Conrad is a 70 y.o. male with a PMHx of CHF, Pulmonary fibrosis, MI, HTN, HLD, CAD, COPD, brought in by ambulance, who presents to the Emergency Department complaining of gradually worsening shortness of breath that started this evening. Pt reports he has had intermittent mild shortness of breath for the past two weeks and states his symptoms became much worse tonight. Pt states he feels better after two nebulizer treatments. Pt also reports associated fever, cough with sputum, mild generalized chest pain. Pt denies any recent weakness. Patient is DNR  Past Medical History  Diagnosis Date  . Pulmonary fibrosis ([place])   . MI (mitral incompetence)   . Hypertension   . Hyperlipidemia   . Coronary artery disease   . CHF (congestive heart failure) ([place])   . COPD (chronic obstructive pulmonary disease) ([place])   . 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 OSA (obstructive sleep apnea)   . Diaphragm paralysis     Left hemidiaphragm   Past Surgical History  Procedure Laterality Date  . Coronary angioplasty with stent placement    . Cataract extraction     Family History  Problem Relation Age of Onset  . Hypertension Mother   . Hypertension Father    Social History  Substance Use Topics  . Smoking status: Former Research scientist (life sciences)  . Smokeless tobacco: None  . Alcohol Use: No     Comment: Quit 8 years ago.    Review of Systems  A complete 10 system review of systems was obtained and all systems are negative except as noted in the HPI and PMH.    Allergies   Prednisone  Home Medications   Prior to Admission medications   Medication Sig Start Date End Date Taking? Authorizing Provider  albuterol (PROVENTIL HFA;VENTOLIN HFA) 108 (90 BASE) MCG/ACT inhaler Inhale 2 puffs into the lungs every 6 (six) hours as needed for wheezing or shortness of breath.    Historical Provider, MD  albuterol (PROVENTIL) (2.5 MG/3ML) 0.083% nebulizer solution Take 2.5 mg by nebulization every 4 (four) hours as needed for wheezing or shortness of breath.    Historical Provider, MD  allopurinol (ZYLOPRIM) 300 MG tablet Take 300 mg by mouth daily.    Historical Provider, MD  bumetanide (BUMEX) 1 MG tablet Take 1.5 tablets (1.5 mg total) by mouth daily. 05/17/16   Shanker Kristeen Mans, MD  cholecalciferol (VITAMIN D) 400 UNITS TABS tablet Take 400 Units by mouth daily.    Historical Provider, MD  feeding supplement, ENSURE ENLIVE, (ENSURE ENLIVE) LIQD Take 237 mLs by mouth 2 (two) times daily between meals. 05/17/16   Shanker Kristeen Mans, MD  ferrous sulfate 325 (65 FE) MG tablet Take 325 mg by mouth 2 (two) times daily.    Historical Provider, MD  fluticasone (FLONASE) 50 MCG/ACT nasal spray Place 2 sprays into both nostrils at bedtime.    Historical Provider, MD  losartan (COZAAR) 50 MG tablet Take 50 mg by mouth daily.    Historical Provider, MD  montelukast (SINGULAIR) 10 MG tablet Take  10 mg by mouth at bedtime.    Historical Provider, MD  mycophenolate (CELLCEPT) 500 MG tablet Take 1,500 mg by mouth 2 (two) times daily.    Historical Provider, MD  OXYGEN Inhale 4 L into the lungs continuous.    Historical Provider, MD  potassium chloride (K-DUR) 10 MEQ tablet Take 2 tablets (20 mEq total) by mouth daily. 05/17/16   Shanker Kristeen Mans, MD  ranitidine (ZANTAC) 150 MG tablet Take 150 mg by mouth 2 (two) times daily.    Historical Provider, MD  simvastatin (ZOCOR) 5 MG tablet Take 5 mg by mouth at bedtime.    Historical Provider, MD  vitamin C (ASCORBIC ACID) 500 MG tablet Take 500 mg  by mouth daily.    Historical Provider, MD   BP 145/85 mmHg  Pulse 107  Resp 26  SpO2 100% Physical Exam  Constitutional: He is oriented to person, place, and time. Vital signs are normal. He appears well-developed and well-nourished.  Non-toxic appearance. He does not appear ill. No distress.  HENT:  Head: Normocephalic and atraumatic.  Nose: Nose normal.  Mouth/Throat: Oropharynx is clear and moist. No oropharyngeal exudate.  Eyes: Conjunctivae and EOM are normal. Pupils are equal, round, and reactive to light. No scleral icterus.  Neck: Normal range of motion. Neck supple. No tracheal deviation, no edema, no erythema and normal range of motion present. No thyroid mass and no thyromegaly present.  Cardiovascular: Regular rhythm, S1 normal, S2 normal, normal heart sounds, intact distal pulses and normal pulses.  Exam reveals no gallop and no friction rub.   No murmur heard. Tachycardic  Pulmonary/Chest: No respiratory distress. He has no wheezes. He has no rhonchi. He has no rales.  Tachypneic, Increased work of breathing, [place] in place, Right lower lung crackles, Decreased breath sounds  Abdominal: Soft. Normal appearance and bowel sounds are normal. He exhibits no distension, no ascites and no mass. There is no hepatosplenomegaly. There is no tenderness. There is no rebound, no guarding and no CVA tenderness.  Musculoskeletal: Normal range of motion. He exhibits no edema or tenderness.  Lymphadenopathy:    He has no cervical adenopathy.  Neurological: He is alert and oriented to person, place, and time. He has normal strength. No cranial nerve deficit or sensory deficit.  Skin: Skin is warm, dry and intact. No petechiae and no rash noted. He is not diaphoretic. No erythema. No pallor.  Psychiatric: He has a normal mood and affect. His behavior is normal. Judgment normal.  Nursing note and vitals reviewed.   ED Course  Procedures  DIAGNOSTIC STUDIES: Oxygen Saturation is 77% on RA,  low by my interpretation.  COORDINATION OF CARE: 11:35 PM-Will order breathing treatment and imaging. Discussed treatment plan with pt at bedside and pt agreed to plan.   Labs Review Labs Reviewed  CBC WITH DIFFERENTIAL/PLATELET - Abnormal; Notable for the following:    WBC 3.5 (*)    RBC 3.51 (*)    Hemoglobin 10.1 (*)    HCT 32.3 (*)    Platelets 144 (*)    Lymphs Abs 0.4 (*)    All other components within normal limits  BLOOD GAS, VENOUS - Abnormal; Notable for the following:    pH, Ven 7.221 (*)    All other components within normal limits  I-STAT CHEM 8, ED - Abnormal; Notable for the following:    Chloride 87 (*)    BUN 32 (*)    Glucose, Bld 116 (*)    Hemoglobin 10.9 (*)  HCT 32.0 (*)    All other components within normal limits  CULTURE, BLOOD (ROUTINE X 2)  CULTURE, BLOOD (ROUTINE X 2)  BRAIN NATRIURETIC PEPTIDE  I-STAT CG4 LACTIC ACID, ED  I-STAT CG4 LACTIC ACID, ED    Imaging Review Dg Chest 2 View  06/06/2016  CLINICAL DATA:  Dyspnea. EXAM: CHEST  2 VIEW COMPARISON:  05/15/2016 FINDINGS: Persistent low lung volumes, with unchanged left hemidiaphragm elevation. Lung base opacities are present, left greater than right, worsened. This could be infectious. No large effusions. IMPRESSION: Very low lung volumes. Lung base opacities have worsened and could be infectious. Electronically Signed   By: Andreas Newport M.D.   On: 06/06/2016 00:16   I have personally reviewed and evaluated these images and lab results as part of my medical decision-making.   EKG Interpretation None      MDM   Final diagnoses:  None   Patient presents to the ED for worsening SOB and productive cough.  He continues to be tachypnic in the ED.  He was already given 2 nebs by EMS, will give a 3rd along with magnesium. He has an allergy to prednisone.  I anticipate admission for hypoxia.  CXR pending for PNA.   12:28 AM CXR reveals PNA.  He is HCAP for recent admission.  Cultures  drawn, he was given vanc/zosyn for treatment.  Will page hospitalist for admission.  1:51 AM Dr. Porfirio Mylar accepts to step down for further care.   I personally performed the services described in this documentation, which was scribed in my presence. The recorded information has been reviewed and is accurate.      Everlene Balls, MD 06/06/16 [phone]

## 2016-06-05 NOTE — ED Notes (Signed)
Pt in radiology 

## 2016-06-06 ENCOUNTER — Inpatient Hospital Stay (HOSPITAL_COMMUNITY): Payer: Medicare Other

## 2016-06-06 ENCOUNTER — Encounter (HOSPITAL_COMMUNITY): Payer: Self-pay | Admitting: Internal Medicine

## 2016-06-06 DIAGNOSIS — I5032 Chronic diastolic (congestive) heart failure: Secondary | ICD-10-CM | POA: Diagnosis not present

## 2016-06-06 DIAGNOSIS — I110 Hypertensive heart disease with heart failure: Secondary | ICD-10-CM | POA: Diagnosis present

## 2016-06-06 DIAGNOSIS — M109 Gout, unspecified: Secondary | ICD-10-CM | POA: Diagnosis present

## 2016-06-06 DIAGNOSIS — J189 Pneumonia, unspecified organism: Secondary | ICD-10-CM

## 2016-06-06 DIAGNOSIS — Z6825 Body mass index (BMI) 25.0-25.9, adult: Secondary | ICD-10-CM | POA: Diagnosis not present

## 2016-06-06 DIAGNOSIS — Z8249 Family history of ischemic heart disease and other diseases of the circulatory system: Secondary | ICD-10-CM | POA: Diagnosis not present

## 2016-06-06 DIAGNOSIS — I340 Nonrheumatic mitral (valve) insufficiency: Secondary | ICD-10-CM | POA: Diagnosis present

## 2016-06-06 DIAGNOSIS — J9622 Acute and chronic respiratory failure with hypercapnia: Secondary | ICD-10-CM | POA: Diagnosis present

## 2016-06-06 DIAGNOSIS — Z923 Personal history of irradiation: Secondary | ICD-10-CM | POA: Diagnosis not present

## 2016-06-06 DIAGNOSIS — D61818 Other pancytopenia: Secondary | ICD-10-CM | POA: Diagnosis present

## 2016-06-06 DIAGNOSIS — E872 Acidosis: Secondary | ICD-10-CM | POA: Diagnosis present

## 2016-06-06 DIAGNOSIS — G9340 Encephalopathy, unspecified: Secondary | ICD-10-CM | POA: Diagnosis present

## 2016-06-06 DIAGNOSIS — J8410 Pulmonary fibrosis, unspecified: Secondary | ICD-10-CM | POA: Diagnosis present

## 2016-06-06 DIAGNOSIS — R000 Tachycardia, unspecified: Secondary | ICD-10-CM | POA: Diagnosis present

## 2016-06-06 DIAGNOSIS — J9621 Acute and chronic respiratory failure with hypoxia: Secondary | ICD-10-CM | POA: Diagnosis present

## 2016-06-06 DIAGNOSIS — E43 Unspecified severe protein-calorie malnutrition: Secondary | ICD-10-CM | POA: Diagnosis present

## 2016-06-06 DIAGNOSIS — Z9119 Patient's noncompliance with other medical treatment and regimen: Secondary | ICD-10-CM | POA: Diagnosis not present

## 2016-06-06 DIAGNOSIS — J440 Chronic obstructive pulmonary disease with acute lower respiratory infection: Secondary | ICD-10-CM | POA: Diagnosis present

## 2016-06-06 DIAGNOSIS — I2510 Atherosclerotic heart disease of native coronary artery without angina pectoris: Secondary | ICD-10-CM | POA: Diagnosis present

## 2016-06-06 DIAGNOSIS — J986 Disorders of diaphragm: Secondary | ICD-10-CM | POA: Diagnosis present

## 2016-06-06 DIAGNOSIS — G4733 Obstructive sleep apnea (adult) (pediatric): Secondary | ICD-10-CM | POA: Diagnosis present

## 2016-06-06 DIAGNOSIS — E785 Hyperlipidemia, unspecified: Secondary | ICD-10-CM | POA: Diagnosis present

## 2016-06-06 DIAGNOSIS — Z955 Presence of coronary angioplasty implant and graft: Secondary | ICD-10-CM | POA: Diagnosis not present

## 2016-06-06 DIAGNOSIS — Z66 Do not resuscitate: Secondary | ICD-10-CM | POA: Diagnosis present

## 2016-06-06 DIAGNOSIS — D509 Iron deficiency anemia, unspecified: Secondary | ICD-10-CM | POA: Diagnosis present

## 2016-06-06 DIAGNOSIS — Y95 Nosocomial condition: Secondary | ICD-10-CM | POA: Diagnosis present

## 2016-06-06 DIAGNOSIS — Z9981 Dependence on supplemental oxygen: Secondary | ICD-10-CM | POA: Diagnosis not present

## 2016-06-06 DIAGNOSIS — R0602 Shortness of breath: Secondary | ICD-10-CM | POA: Diagnosis present

## 2016-06-06 DIAGNOSIS — F419 Anxiety disorder, unspecified: Secondary | ICD-10-CM | POA: Diagnosis present

## 2016-06-06 DIAGNOSIS — Z79899 Other long term (current) drug therapy: Secondary | ICD-10-CM | POA: Diagnosis not present

## 2016-06-06 DIAGNOSIS — Z87891 Personal history of nicotine dependence: Secondary | ICD-10-CM | POA: Diagnosis not present

## 2016-06-06 DIAGNOSIS — Z888 Allergy status to other drugs, medicaments and biological substances status: Secondary | ICD-10-CM | POA: Diagnosis not present

## 2016-06-06 DIAGNOSIS — Z8589 Personal history of malignant neoplasm of other organs and systems: Secondary | ICD-10-CM | POA: Diagnosis not present

## 2016-06-06 DIAGNOSIS — Z7951 Long term (current) use of inhaled steroids: Secondary | ICD-10-CM | POA: Diagnosis not present

## 2016-06-06 DIAGNOSIS — Z515 Encounter for palliative care: Secondary | ICD-10-CM | POA: Diagnosis not present

## 2016-06-06 DIAGNOSIS — J441 Chronic obstructive pulmonary disease with (acute) exacerbation: Secondary | ICD-10-CM | POA: Diagnosis present

## 2016-06-06 DIAGNOSIS — D638 Anemia in other chronic diseases classified elsewhere: Secondary | ICD-10-CM | POA: Diagnosis present

## 2016-06-06 LAB — RESPIRATORY PANEL BY PCR
Adenovirus: NOT DETECTED
BORDETELLA PERTUSSIS-RVPCR: NOT DETECTED
CORONAVIRUS OC43-RVPPCR: NOT DETECTED
Chlamydophila pneumoniae: NOT DETECTED
Coronavirus 229E: NOT DETECTED
Coronavirus HKU1: NOT DETECTED
Coronavirus NL63: NOT DETECTED
INFLUENZA A H1 2009-RVPPR: NOT DETECTED
INFLUENZA A H1-RVPPCR: NOT DETECTED
Influenza A H3: NOT DETECTED
Influenza A: NOT DETECTED
Influenza B: NOT DETECTED
METAPNEUMOVIRUS-RVPPCR: NOT DETECTED
MYCOPLASMA PNEUMONIAE-RVPPCR: NOT DETECTED
PARAINFLUENZA VIRUS 1-RVPPCR: NOT DETECTED
PARAINFLUENZA VIRUS 2-RVPPCR: NOT DETECTED
PARAINFLUENZA VIRUS 3-RVPPCR: NOT DETECTED
Parainfluenza Virus 4: NOT DETECTED
RESPIRATORY SYNCYTIAL VIRUS-RVPPCR: NOT DETECTED
RHINOVIRUS / ENTEROVIRUS - RVPPCR: NOT DETECTED

## 2016-06-06 LAB — I-STAT CHEM 8, ED
BUN: 32 mg/dL — ABNORMAL HIGH (ref 6–20)
Calcium, Ion: 1.22 mmol/L (ref 1.12–1.23)
Chloride: 87 mmol/L — ABNORMAL LOW (ref 101–111)
Creatinine, Ser: 1 mg/dL (ref 0.61–1.24)
Glucose, Bld: 116 mg/dL — ABNORMAL HIGH (ref 65–99)
HEMATOCRIT: 32 % — AB (ref 39.0–52.0)
HEMOGLOBIN: 10.9 g/dL — AB (ref 13.0–17.0)
POTASSIUM: 4 mmol/L (ref 3.5–5.1)
SODIUM: 136 mmol/L (ref 135–145)
TCO2: 42 mmol/L (ref 0–100)

## 2016-06-06 LAB — CBC WITH DIFFERENTIAL/PLATELET
BASOS PCT: 0 %
Basophils Absolute: 0 10*3/uL (ref 0.0–0.1)
EOS ABS: 0 10*3/uL (ref 0.0–0.7)
Eosinophils Relative: 1 %
HCT: 32.3 % — ABNORMAL LOW (ref 39.0–52.0)
HEMOGLOBIN: 10.1 g/dL — AB (ref 13.0–17.0)
LYMPHS ABS: 0.4 10*3/uL — AB (ref 0.7–4.0)
Lymphocytes Relative: 12 %
MCH: 28.8 pg (ref 26.0–34.0)
MCHC: 31.3 g/dL (ref 30.0–36.0)
MCV: 92 fL (ref 78.0–100.0)
Monocytes Absolute: 0.3 10*3/uL (ref 0.1–1.0)
Monocytes Relative: 10 %
NEUTROS PCT: 77 %
Neutro Abs: 2.7 10*3/uL (ref 1.7–7.7)
Platelets: 144 10*3/uL — ABNORMAL LOW (ref 150–400)
RBC: 3.51 MIL/uL — AB (ref 4.22–5.81)
RDW: 15.2 % (ref 11.5–15.5)
WBC: 3.5 10*3/uL — AB (ref 4.0–10.5)

## 2016-06-06 LAB — BLOOD GAS, VENOUS
O2 SAT: 59.8 %
PH VEN: 7.221 — AB (ref 7.250–7.300)
PO2 VEN: 37 mmHg (ref 31.0–45.0)
Patient temperature: 98.6

## 2016-06-06 LAB — BLOOD GAS, ARTERIAL
Acid-Base Excess: 12.3 mmol/L — ABNORMAL HIGH (ref 0.0–2.0)
BICARBONATE: 41.6 meq/L — AB (ref 20.0–24.0)
Drawn by: 11249
O2 CONTENT: 6 L/min
O2 Saturation: 96 %
PCO2 ART: 87.9 mmHg — AB (ref 35.0–45.0)
PH ART: 7.29 — AB (ref 7.350–7.450)
Patient temperature: 96.8
TCO2: 39.5 mmol/L (ref 0–100)
pO2, Arterial: 82.3 mmHg (ref 80.0–100.0)

## 2016-06-06 LAB — LACTIC ACID, PLASMA
LACTIC ACID, VENOUS: 1.2 mmol/L (ref 0.5–1.9)
Lactic Acid, Venous: 0.6 mmol/L (ref 0.5–1.9)

## 2016-06-06 LAB — PROTIME-INR
INR: 0.93 (ref 0.00–1.49)
PROTHROMBIN TIME: 12.7 s (ref 11.6–15.2)

## 2016-06-06 LAB — BRAIN NATRIURETIC PEPTIDE: B NATRIURETIC PEPTIDE 5: 40 pg/mL (ref 0.0–100.0)

## 2016-06-06 LAB — MRSA PCR SCREENING: MRSA by PCR: NEGATIVE

## 2016-06-06 LAB — I-STAT CG4 LACTIC ACID, ED: LACTIC ACID, VENOUS: 0.94 mmol/L (ref 0.5–1.9)

## 2016-06-06 LAB — STREP PNEUMONIAE URINARY ANTIGEN: STREP PNEUMO URINARY ANTIGEN: NEGATIVE

## 2016-06-06 LAB — PROCALCITONIN: Procalcitonin: <0.1 ng/mL

## 2016-06-06 LAB — APTT: aPTT: 29 seconds (ref 24–37)

## 2016-06-06 MED ORDER — BUMETANIDE 1 MG PO TABS: 1.5000 mg | ORAL_TABLET | Freq: Every day | ORAL | Status: DC

## 2016-06-06 MED ORDER — VANCOMYCIN HCL 10 G IV SOLR
1500.0000 mg | Freq: Once | INTRAVENOUS | Status: AC
  Administered 2016-06-06: 1500 mg via INTRAVENOUS
  Filled 2016-06-06: qty 1500

## 2016-06-06 MED ORDER — LOSARTAN POTASSIUM 50 MG PO TABS
50.0000 mg | ORAL_TABLET | Freq: Every day | ORAL | Status: DC
  Administered 2016-06-06 – 2016-06-09 (×4): 50 mg via ORAL
  Filled 2016-06-06 (×4): qty 1

## 2016-06-06 MED ORDER — CHOLECALCIFEROL 10 MCG (400 UNIT) PO TABS
400.0000 [IU] | ORAL_TABLET | Freq: Every day | ORAL | Status: DC
  Administered 2016-06-07 – 2016-06-09 (×3): 400 [IU] via ORAL
  Filled 2016-06-06 (×4): qty 1

## 2016-06-06 MED ORDER — ALLOPURINOL 300 MG PO TABS
300.0000 mg | ORAL_TABLET | Freq: Every day | ORAL | Status: DC
  Administered 2016-06-06 – 2016-06-09 (×4): 300 mg via ORAL
  Filled 2016-06-06: qty 1
  Filled 2016-06-06: qty 3
  Filled 2016-06-06: qty 1
  Filled 2016-06-06: qty 3
  Filled 2016-06-06 (×2): qty 1

## 2016-06-06 MED ORDER — VANCOMYCIN HCL IN DEXTROSE 1-5 GM/200ML-% IV SOLN
1000.0000 mg | Freq: Three times a day (TID) | INTRAVENOUS | Status: DC
  Administered 2016-06-06 – 2016-06-08 (×6): 1000 mg via INTRAVENOUS
  Filled 2016-06-06 (×8): qty 200

## 2016-06-06 MED ORDER — IPRATROPIUM-ALBUTEROL 0.5-2.5 (3) MG/3ML IN SOLN
3.0000 mL | Freq: Once | RESPIRATORY_TRACT | Status: AC
  Administered 2016-06-06: 3 mL via RESPIRATORY_TRACT
  Filled 2016-06-06: qty 3

## 2016-06-06 MED ORDER — DM-GUAIFENESIN ER 30-600 MG PO TB12
1.0000 | ORAL_TABLET | Freq: Two times a day (BID) | ORAL | Status: DC
  Administered 2016-06-06 – 2016-06-09 (×7): 1 via ORAL
  Filled 2016-06-06 (×7): qty 1

## 2016-06-06 MED ORDER — BUMETANIDE 1 MG PO TABS
1.5000 mg | ORAL_TABLET | Freq: Every day | ORAL | Status: DC
  Administered 2016-06-06 – 2016-06-09 (×4): 1.5 mg via ORAL
  Filled 2016-06-06 (×4): qty 1

## 2016-06-06 MED ORDER — LEVALBUTEROL HCL 1.25 MG/0.5ML IN NEBU: 1.2500 mg | INHALATION_SOLUTION | Freq: Four times a day (QID) | RESPIRATORY_TRACT | Status: DC

## 2016-06-06 MED ORDER — FAMOTIDINE 20 MG PO TABS
20.0000 mg | ORAL_TABLET | Freq: Every day | ORAL | Status: DC
  Administered 2016-06-06 – 2016-06-08 (×3): 20 mg via ORAL
  Filled 2016-06-06 (×3): qty 1

## 2016-06-06 MED ORDER — PIPERACILLIN-TAZOBACTAM 3.375 G IVPB 30 MIN
3.3750 g | Freq: Once | INTRAVENOUS | Status: AC
  Administered 2016-06-06: 3.375 g via INTRAVENOUS
  Filled 2016-06-06: qty 50

## 2016-06-06 MED ORDER — METHYLPREDNISOLONE SODIUM SUCC 125 MG IJ SOLR
60.0000 mg | Freq: Three times a day (TID) | INTRAMUSCULAR | Status: DC
  Administered 2016-06-06 – 2016-06-08 (×7): 60 mg via INTRAVENOUS
  Filled 2016-06-06 (×7): qty 2

## 2016-06-06 MED ORDER — MONTELUKAST SODIUM 10 MG PO TABS
10.0000 mg | ORAL_TABLET | Freq: Every day | ORAL | Status: DC
  Administered 2016-06-06 – 2016-06-08 (×3): 10 mg via ORAL
  Filled 2016-06-06 (×3): qty 1

## 2016-06-06 MED ORDER — MYCOPHENOLATE MOFETIL 250 MG PO CAPS
1500.0000 mg | ORAL_CAPSULE | Freq: Two times a day (BID) | ORAL | Status: DC
  Administered 2016-06-06 – 2016-06-09 (×7): 1500 mg via ORAL
  Filled 2016-06-06 (×7): qty 6

## 2016-06-06 MED ORDER — VITAMIN C 500 MG PO TABS
500.0000 mg | ORAL_TABLET | Freq: Every day | ORAL | Status: DC
  Administered 2016-06-06 – 2016-06-09 (×4): 500 mg via ORAL
  Filled 2016-06-06 (×4): qty 1

## 2016-06-06 MED ORDER — SODIUM CHLORIDE 0.9 % IV SOLN
INTRAVENOUS | Status: DC
  Administered 2016-06-06: 05:00:00 via INTRAVENOUS

## 2016-06-06 MED ORDER — FERROUS SULFATE 325 (65 FE) MG PO TABS
325.0000 mg | ORAL_TABLET | Freq: Two times a day (BID) | ORAL | Status: DC
  Administered 2016-06-06 – 2016-06-09 (×7): 325 mg via ORAL
  Filled 2016-06-06 (×7): qty 1

## 2016-06-06 MED ORDER — POTASSIUM CHLORIDE ER 10 MEQ PO TBCR
20.0000 meq | EXTENDED_RELEASE_TABLET | Freq: Every day | ORAL | Status: DC
  Administered 2016-06-06 – 2016-06-09 (×4): 20 meq via ORAL
  Filled 2016-06-06 (×8): qty 2

## 2016-06-06 MED ORDER — PIPERACILLIN-TAZOBACTAM 3.375 G IVPB 30 MIN: 3.3750 g | Freq: Three times a day (TID) | INTRAVENOUS | Status: DC

## 2016-06-06 MED ORDER — ENSURE ENLIVE PO LIQD
237.0000 mL | Freq: Two times a day (BID) | ORAL | Status: DC
  Administered 2016-06-08 – 2016-06-09 (×3): 237 mL via ORAL

## 2016-06-06 MED ORDER — IPRATROPIUM BROMIDE 0.02 % IN SOLN
0.5000 mg | Freq: Four times a day (QID) | RESPIRATORY_TRACT | Status: DC
  Administered 2016-06-06 – 2016-06-09 (×13): 0.5 mg via RESPIRATORY_TRACT
  Filled 2016-06-06 (×13): qty 2.5

## 2016-06-06 MED ORDER — PIPERACILLIN-TAZOBACTAM 3.375 G IVPB
3.3750 g | Freq: Three times a day (TID) | INTRAVENOUS | Status: DC
  Administered 2016-06-06 – 2016-06-08 (×7): 3.375 g via INTRAVENOUS
  Filled 2016-06-06 (×10): qty 50

## 2016-06-06 MED ORDER — FLUTICASONE PROPIONATE 50 MCG/ACT NA SUSP
2.0000 | Freq: Every day | NASAL | Status: DC
  Administered 2016-06-06 – 2016-06-08 (×3): 2 via NASAL
  Filled 2016-06-06: qty 16

## 2016-06-06 MED ORDER — MAGNESIUM SULFATE 2 GM/50ML IV SOLN
2.0000 g | Freq: Once | INTRAVENOUS | Status: AC
  Administered 2016-06-06: 2 g via INTRAVENOUS
  Filled 2016-06-06: qty 50

## 2016-06-06 MED ORDER — SIMVASTATIN 5 MG PO TABS
5.0000 mg | ORAL_TABLET | Freq: Every day | ORAL | Status: DC
  Administered 2016-06-06 – 2016-06-08 (×3): 5 mg via ORAL
  Filled 2016-06-06 (×3): qty 1

## 2016-06-06 MED ORDER — IPRATROPIUM BROMIDE 0.02 % IN SOLN: 0.5000 mg | RESPIRATORY_TRACT | Status: DC

## 2016-06-06 MED ORDER — LORAZEPAM 2 MG/ML IJ SOLN: 0.2500 mg | Freq: Once | INTRAMUSCULAR | Status: DC

## 2016-06-06 MED ORDER — ENOXAPARIN SODIUM 40 MG/0.4ML ~~LOC~~ SOLN
40.0000 mg | SUBCUTANEOUS | Status: DC
  Administered 2016-06-06 – 2016-06-09 (×4): 40 mg via SUBCUTANEOUS
  Filled 2016-06-06 (×4): qty 0.4

## 2016-06-06 MED ORDER — LEVALBUTEROL HCL 1.25 MG/0.5ML IN NEBU
1.2500 mg | INHALATION_SOLUTION | Freq: Four times a day (QID) | RESPIRATORY_TRACT | Status: DC
  Administered 2016-06-06 – 2016-06-09 (×13): 1.25 mg via RESPIRATORY_TRACT
  Filled 2016-06-06 (×15): qty 0.5

## 2016-06-06 NOTE — Progress Notes (Signed)
Patient with BP in the [id number] systolic, IVF is at [facility].  Not eating much, is drinking some soda and water throughout the day.  Paged the doctor about low BP.  No new orders currently.  Continue to monitor patient.  Jana Swartzlander Roselie Awkward RN

## 2016-06-06 NOTE — ED Notes (Signed)
Ativan has not been verified by pharmacy and will need to be given once verified.

## 2016-06-06 NOTE — Progress Notes (Signed)
RT gave neb tx. Pt not on BIPAP at this time. Pt setting up in bed taking in full sentences.

## 2016-06-06 NOTE — H&P (Addendum)
History and Physical    [name] [id number] DOB: 10-29-1946 DOA: 06/05/2016  Referring MD/NP/PA:   PCP: Sandi Mariscal, MD   Patient coming from:  [facility]  At baseline, pt is dependent for most of ADL.   Chief Complaint: shortness of breath and cough  HPI: Joshua Conrad is a 70 y.o. male with medical history significant of chronic respiratory failure on 5L oxygen and BiPAP at night at home, pulmonary fibrosis, COPD, Kaposi's sarcoma, OSA, left diaphragm paralysis, dCHF, CAD, s/p of  Stent placement, gout, hyperlipidemia, hypertension, who presents with SOB and cough.  Patient was recently hospitalized from 6/4-6/7 due to acute on chronic respiratory failure, which was likely due to acute diastolic CHF, with underlying pulmonary fibrosis and left diaphragmatic paralysis. Pt was discharged to SNF at a stable condition. Pt is using Bipap at night and 5 L of O2 at home.   Pt states that his SOB has been worsening since yesterday. He Has productive cough, but no fever, chills. He has mild generalized chest pain when he coughs. He cannot speak in full sentence. He denies nausea, vomiting, diarrhea, abdominal pain. No unilateral weakness. Denies symptoms of UTI. Patient is DNR  ED Course: pt was found to have WBC 3.5, tachycardia, tachypnea, lactate 0.94, oxygen desaturated to 77% with 5 L of nasal canula O2, temperature normal. CXR showed very low lung volume, lung base opacities have worsened. Pt is admitted to stepdown bed for further evaluation and treatment.  Review of Systems:   General: no fevers, chills, no changes in body weight, has poor appetite, has fatigue HEENT: no blurry vision, hearing changes or sore throat Pulm: has dyspnea, coughing, no wheezing CV: has mild chest pain, no palpitations Abd: no nausea, vomiting, abdominal pain, diarrhea, constipation GU: no dysuria, burning on urination, increased urinary frequency, hematuria  Ext: no leg edema Neuro:  no unilateral weakness, numbness, or tingling, no vision change or hearing loss Skin: no rash MSK: No muscle spasm, no deformity, no limitation of range of movement in spin Heme: No easy bruising.  Travel history: No recent long distant travel.  Allergy:  Allergies  Allergen Reactions  . Prednisone     Caused pt to go crazy     Past Medical History  Diagnosis Date  . Pulmonary fibrosis ([place])   . MI (mitral incompetence)   . Hypertension   . Hyperlipidemia   . Coronary artery disease   . CHF (congestive heart failure) ([place])   . COPD (chronic obstructive pulmonary disease) ([place])   . Kaposi sarcoma ([place])   . OSA (obstructive sleep apnea)   . Diaphragm paralysis     Left hemidiaphragm    Past Surgical History  Procedure Laterality Date  . Coronary angioplasty with stent placement    . Cataract extraction      Social History:  reports that he has quit smoking. He does not have any smokeless tobacco history on file. He reports that he does not drink alcohol. His drug history is not on file.  Family History:  Family History  Problem Relation Age of Onset  . Hypertension Mother   . Hypertension Father      Prior to Admission medications   Medication Sig Start Date End Date Taking? Authorizing Provider  albuterol (PROVENTIL HFA;VENTOLIN HFA) 108 (90 BASE) MCG/ACT inhaler Inhale 2 puffs into the lungs every 6 (six) hours as needed for wheezing or shortness of breath.   Yes Historical Provider, MD  albuterol (PROVENTIL) (  2.5 MG/3ML) 0.083% nebulizer solution Take 2.5 mg by nebulization every 4 (four) hours as needed for wheezing or shortness of breath.   Yes Historical Provider, MD  allopurinol (ZYLOPRIM) 300 MG tablet Take 300 mg by mouth daily.   Yes Historical Provider, MD  bumetanide (BUMEX) 1 MG tablet Take 1.5 tablets (1.5 mg total) by mouth daily. 05/17/16  Yes Shanker Kristeen Mans, MD  cholecalciferol (VITAMIN D) 400 UNITS TABS tablet Take 400 Units by mouth daily.   Yes  Historical Provider, MD  feeding supplement, ENSURE ENLIVE, (ENSURE ENLIVE) LIQD Take 237 mLs by mouth 2 (two) times daily between meals. 05/17/16  Yes Shanker Kristeen Mans, MD  ferrous sulfate 325 (65 FE) MG tablet Take 325 mg by mouth 2 (two) times daily.   Yes Historical Provider, MD  fluticasone (FLONASE) 50 MCG/ACT nasal spray Place 2 sprays into both nostrils at bedtime.   Yes Historical Provider, MD  losartan (COZAAR) 50 MG tablet Take 50 mg by mouth daily.   Yes Historical Provider, MD  montelukast (SINGULAIR) 10 MG tablet Take 10 mg by mouth at bedtime.   Yes Historical Provider, MD  mycophenolate (CELLCEPT) 500 MG tablet Take 1,500 mg by mouth 2 (two) times daily.   Yes Historical Provider, MD  potassium chloride (K-DUR) 10 MEQ tablet Take 2 tablets (20 mEq total) by mouth daily. 05/17/16  Yes Shanker Kristeen Mans, MD  ranitidine (ZANTAC) 150 MG tablet Take 150 mg by mouth 2 (two) times daily.   Yes Historical Provider, MD  simvastatin (ZOCOR) 5 MG tablet Take 5 mg by mouth at bedtime.   Yes Historical Provider, MD  vitamin C (ASCORBIC ACID) 500 MG tablet Take 500 mg by mouth daily.   Yes Historical Provider, MD  OXYGEN Inhale 4 L into the lungs continuous.    Historical Provider, MD    Physical Exam: Filed Vitals:   06/06/16 0109 06/06/16 0220 06/06/16 0230 06/06/16 0245  BP:  138/83 141/75 124/84  Pulse: 103 96 101 115  Temp:  97.5 F (36.4 C)    TempSrc:  Oral    Resp: 18 18    SpO2: 92% 90% 100% 86%   General: Not moderate distress. HEENT:       Eyes: PERRL, EOMI, no scleral icterus.       ENT: No discharge from the ears and nose, no pharynx injection, no tonsillar enlargement.        Neck: No JVD, no bruit, no mass felt. Heme: No neck lymph node enlargement. Cardiac: S1/S2, RRR, No murmurs, No gallops or rubs. Pulm: has fine crackles bilaterally, No wheezing, rhonchi or rubs. Abd: Soft, nondistended, nontender, no rebound pain, no organomegaly, BS present. GU: No  hematuria Ext: No pitting leg edema bilaterally. 2+DP/PT pulse bilaterally. Musculoskeletal: No joint deformities, No joint redness or warmth, no limitation of ROM in spin. Skin: No rashes.  Neuro: Alert, oriented X3, cranial nerves II-XII grossly intact, moves all extremities. Psych: Patient is not psychotic, no suicidal or hemocidal ideation.  Labs on Admission: I have personally reviewed following labs and imaging studies  CBC:  Recent Labs Lab 06/06/16 0044 06/06/16 0055  WBC 3.5*  --   NEUTROABS 2.7  --   HGB 10.1* 10.9*  HCT 32.3* 32.0*  MCV 92.0  --   PLT 144*  --    Basic Metabolic Panel:  Recent Labs Lab 06/06/16 0055  NA 136  K 4.0  CL 87*  GLUCOSE 116*  BUN 32*  CREATININE 1.00  GFR: CrCl cannot be calculated (Unknown ideal weight.). Liver Function Tests: No results for input(s): AST, ALT, ALKPHOS, BILITOT, PROT, ALBUMIN in the last 168 hours. No results for input(s): LIPASE, AMYLASE in the last 168 hours. No results for input(s): AMMONIA in the last 168 hours. Coagulation Profile: No results for input(s): INR, PROTIME in the last 168 hours. Cardiac Enzymes: No results for input(s): CKTOTAL, CKMB, CKMBINDEX, TROPONINI in the last 168 hours. BNP (last 3 results) No results for input(s): PROBNP in the last 8760 hours. HbA1C: No results for input(s): HGBA1C in the last 72 hours. CBG: No results for input(s): GLUCAP in the last 168 hours. Lipid Profile: No results for input(s): CHOL, HDL, LDLCALC, TRIG, CHOLHDL, LDLDIRECT in the last 72 hours. Thyroid Function Tests: No results for input(s): TSH, T4TOTAL, FREET4, T3FREE, THYROIDAB in the last 72 hours. Anemia Panel: No results for input(s): VITAMINB12, FOLATE, FERRITIN, TIBC, IRON, RETICCTPCT in the last 72 hours. Urine analysis:    Component Value Date/Time   COLORURINE YELLOW 05/14/2016 1750   APPEARANCEUR CLEAR 05/14/2016 1750   LABSPEC 1.014 05/14/2016 1750   PHURINE 6.0 05/14/2016 1750    GLUCOSEU NEGATIVE 05/14/2016 1750   HGBUR NEGATIVE 05/14/2016 1750   BILIRUBINUR NEGATIVE 05/14/2016 1750   KETONESUR NEGATIVE 05/14/2016 1750   PROTEINUR NEGATIVE 05/14/2016 1750   NITRITE NEGATIVE 05/14/2016 1750   LEUKOCYTESUR NEGATIVE 05/14/2016 1750   Sepsis Labs: @LABRCNTIP (procalcitonin:4,lacticidven:4) )No results found for this or any previous visit (from the past 240 hour(s)).   Radiological Exams on Admission: Dg Chest 2 View  06/06/2016  CLINICAL DATA:  Dyspnea. EXAM: CHEST  2 VIEW COMPARISON:  05/15/2016 FINDINGS: Persistent low lung volumes, with unchanged left hemidiaphragm elevation. Lung base opacities are present, left greater than right, worsened. This could be infectious. No large effusions. IMPRESSION: Very low lung volumes. Lung base opacities have worsened and could be infectious. Electronically Signed   By: Andreas Newport M.D.   On: 06/06/2016 00:16     EKG: Independently reviewed. Sinus rhythm, QTC 392, tachycardia Assessment/Plan Principal Problem:   Acute on chronic respiratory failure with hypoxia and hypercapnia (HCC) Active Problems:   COPD (chronic obstructive pulmonary disease) (HCC)   CAD (coronary artery disease)   OSA (obstructive sleep apnea)   Chronic diastolic CHF (congestive heart failure) (HCC)   Chronic anemia   HCAP (healthcare-associated pneumonia)   Diaphragm paralysis   Pulmonary fibrosis (HCC)   Hyperlipidemia   Acute on chronic respiratory failure with hypoxia (HCC)   Acute on chronic respiratory failure with hypoxia and hypercapnia ([place]): this is likely multifactorial, including pulmonary fibrosis, COPD, possible pneumonia (HCAP given worsening lung base opacities on CXR). BNP is 40, no leg edema, does not seem to have CHF exacerbation.  - Will admit to [facility] as inpt. - IV Vancomycin Zosyn were started by EDP, will continue - Mucinex for cough  - continue Singulair - Atrovent nebs and prn Xopenex Neb prn for SOB - Urine  legionella and S. pneumococcal antigen - Follow up blood culture x2, sputum culture and respiratory virus panel - Get ABG - full precaution - will consult to palliative care - start BiPAP  COPD: no wheezing or rhonchi, no obvious exacerbation. -see above  Sepsis: pt meets criteria for sepsis, with tachycardia, tachypnea and WBC<4.0. His lactate is normal, hemodynamically stable. - will get Procalcitonin and trend lactic acid level per sepsis protocol - IVF: NS at 75 mL per hour of NS  for 6 hours (patient has a diastolic congestive heart  failure, limiting aggressive IV fluids treatment)  -f/u Bx  CAD (coronary artery disease): s/p of stent. No CP -continue zocor  Chronic diastolic CHF (congestive heart failure) ([place]): 2-D echo on 05/15/16 showed EF 60% with grade 1 diastolic difficulty. Patient does not have leg edema. BNP is normal. CHF is compensated. -Hold off diuretics, Bumetanide  OSA (obstructive sleep apnea): -on BiPAP  Lt diaphragm paralysis: -BiPAP qhs   HTN: stable - continue losartan  Pulmonary fibrosis: used to be followed by at [facility]. -continue Mycophenolate -please ensure [facility] at discharge  Chronic anemia: close to previous levels, could be related to Mycophenolate. Anemia panel in 11/16 was c/w chronic disease -f/u by CBC  H/o Kaposi sarcoma:  -treated with XRT  Gout: stable -continue Allopurinol  Deconditioning/Gen weakness: secondary to acute on chronic disease -pt/ot  Severe malnutrition in context of chronic illness: -continue nutritionsupplements   DVT ppx: SQ Lovenox Code Status: Full code Family Communication: None at bed side.  Disposition Plan:  Anticipate discharge back to previous home environment Consults called:  none Admission status: SDU/inpation       Date of Service 06/06/2016    Ivor Costa [facility] Pager [phone]  If 7PM-7AM, please contact night-coverage [email or web address] Password [facility] 06/06/2016, 3:23  AM

## 2016-06-06 NOTE — Progress Notes (Signed)
PT Cancellation Note  Patient Details Name: Micharl Saban MRN: [id number] DOB: 1946/07/25   Cancelled Treatment:    Reason Eval/Treat Not Completed: Fatigue/lethargy limiting ability to participate RN reports pt very lethargic today   Siyon Linck,KATHrine E 06/06/2016, 3:30 PM Carmelia Bake, PT, DPT 06/06/2016 Pager: [phone]

## 2016-06-06 NOTE — Progress Notes (Signed)
Palliative:  Joshua Conrad is very sleepy when I come by today. I could not get him fully awake for conversation - he would only moan. I did speak with his sister-in-law Joshua Conrad who says she is his only family left. [name] lived with Joshua Conrad prior to going to [facility] earlier this month. Joshua Conrad expresses that there has been some discussion regarding hospice and this was recommended by pulmonologist at [facility] ~3 months ago. She said that when hospice evaluated they said that he was still receiving aggressive care by receiving mycophenolate and they could not take him d/t this medication. Joshua Conrad believes that QOL is most important at this stage and she is interested in options and hoping that when can discuss more with [name] over the next few days. Joshua Conrad will be present to meet Thursday 06/08/2016 0900 am. Please call Joshua Conrad with any changes. I will follow up and attempt to engage with [name] again tomorrow.   No Charge  Vinie Sill, NP Palliative Medicine Team Pager # [phone] (M-F 8a-5p) Team Phone # [phone] (Nights/Weekends)

## 2016-06-06 NOTE — Progress Notes (Signed)
Received call from RN for patient "ripped off" BiPAP mask, refusing to wear it. Now on 6L nasal O2.

## 2016-06-06 NOTE — ED Notes (Signed)
Notified phlebotomy of needing blood.

## 2016-06-06 NOTE — Progress Notes (Signed)
PROGRESS NOTE                                                                                                                                                                                                             Patient Demographics:    Joshua Conrad, is a 70 y.o. male, DOB - 08-23-1946, [id number]  Admit date - 06/05/2016   Admitting Physician Ivor Costa, MD  Outpatient Primary MD for the patient is Sandi Mariscal, MD  LOS - 0  Chief Complaint  Patient presents with  . Shortness of Breath       Brief Narrative    Joshua Conrad is a 70 y.o. male with medical history significant of chronic respiratory failure on 5L oxygen and BiPAP at night at home, pulmonary fibrosis, COPD, Kaposi's sarcoma, OSA, left diaphragm paralysis, dCHF, CAD, s/p of Stent placement, gout, hyperlipidemia, hypertension, who presents with SOB and cough.  Patient was recently hospitalized from 6/4-6/7 due to acute on chronic respiratory failure, which was likely due to acute diastolic CHF, with underlying pulmonary fibrosis and left diaphragmatic paralysis. Pt was discharged to SNF at a stable condition. Pt is using Bipap at night and 5 L of O2 at home.   Pt states that his SOB has been worsening since yesterday. He Has productive cough, but no fever, chills. He has mild generalized chest pain when he coughs. He cannot speak in full sentence. He denies nausea, vomiting, diarrhea, abdominal pain. No unilateral weakness. Denies symptoms of UTI. Patient is DNR  ED Course: pt was found to have WBC 3.5, tachycardia, tachypnea, lactate 0.94, oxygen desaturated to 77% with 5 L of nasal canula O2, temperature normal. CXR showed very low lung volume, lung base opacities have worsened. Pt is admitted to stepdown bed for further evaluation and treatment.    Subjective:    Joshua Conrad today Is somnolent, does not follow commands or answer questions reliably upon my  exam.   Assessment  & Plan :    Principal Problem:   Acute on chronic respiratory failure with hypoxia and hypercapnia (HCC) Active Problems:   COPD (chronic obstructive pulmonary disease) (HCC)   CAD (coronary artery disease)   OSA (obstructive sleep apnea)   Chronic diastolic CHF (congestive heart failure) (HCC)   Chronic anemia   Generalized weakness   HCAP (healthcare-associated pneumonia)  Diaphragm paralysis   Pulmonary fibrosis (HCC)   Hyperlipidemia   Acute on chronic respiratory failure with hypoxia (HCC)   Gout   Protein-calorie malnutrition, severe ([place])   1.Pneumonia in a patient with advanced pulmonary fibrosis and COPD on 5-6 L nasal cannula oxygen at baseline and nighttime BiPAP.  Do not think he was septic, afebrile, lactate normal, pro-calcitonin normal, chest x-ray shows worsening bilateral infiltrates, BNP stable, rule out aspiration with soft diet and speech evaluation, follow cultures, continue empiric antibiotics which are vancomycin and Zosyn for now.  2. Obstructive sleep apnea, Advanced pulmonary fibrosis and COPD. With acute on chronic hypoxic and hypercapnic respiratory failure due to combination of pneumonia above and possibly mild COPD exacerbation with evidence of hypercapnia and acute on chronic respiratory acidosis.   Plan as above, BiPAP as needed in day and scheduled at nighttime, nebulizer treatments and oxygen to continue, keep pulse ox around 90, IV Solu-Medrol. Once he is better he'll follow with his pulmonologist at [facility].     3. Chronic diastolic CHF with EF [id number]. Appears compensated. BNP stable. Resume home dose diuretic.  4. Chronic iron deficiency anemia. On oral and supplementation continue.  5. Gout. On allopurinol.  6. History of Kaposi's sarcoma. Treated with radiation. Outpatient follow-up.  7. Generalized deconditioning along with severe malnutrition. PT and nutritional supplements. Long-term prognosis guarded. Have called  palliative care.  8. Hypoxic and hypercapnic encephalopathy. Treatment as in #  1 and 2 above. Minimize narcotics and benzodiazepines, speech and PT eval, feeding assistance and aspiration precautions. Soft diet for now. Apparently around 9:30 AM per nursing staff patient has woken up nicely and is sitting up in the bed and following commands.  9. Dyslipidemia. On statin continue.  10. CAD. Status post and placement. No chest pain, no acute issues. Continue statin for secondary prevention.    Code Status :  DNR  Family Communication  :  None present  Disposition Plan  :  Step down  Consults  :  Pall Care  Procedures  :   DVT Prophylaxis  :  Lovenox   Lab Results  Component Value Date   PLT 144* 06/06/2016    Inpatient Medications  Scheduled Meds: . allopurinol  300 mg Oral Daily  . cholecalciferol  400 Units Oral Daily  . dextromethorphan-guaiFENesin  1 tablet Oral BID  . enoxaparin (LOVENOX) injection  40 mg Subcutaneous Q24H  . famotidine  20 mg Oral QHS  . feeding supplement (ENSURE ENLIVE)  237 mL Oral BID BM  . ferrous sulfate  325 mg Oral BID  . fluticasone  2 spray Each Nare QHS  . ipratropium  0.5 mg Nebulization Q6H  . levalbuterol  1.25 mg Nebulization Q6H  . LORazepam  0.25 mg Intravenous Once  . losartan  50 mg Oral Daily  . montelukast  10 mg Oral QHS  . mycophenolate  1,500 mg Oral BID  . piperacillin-tazobactam (ZOSYN)  IV  3.375 g Intravenous Q8H  . simvastatin  5 mg Oral QHS  . vancomycin  1,000 mg Intravenous Q8H  . vitamin C  500 mg Oral Daily   Continuous Infusions:  PRN Meds:.  Antibiotics  :    Anti-infectives    Start     Dose/Rate Route Frequency Ordered Stop   06/06/16 1400  vancomycin (VANCOCIN) IVPB 1000 mg/200 mL premix     1,000 mg 200 mL/hr over 60 Minutes Intravenous Every 8 hours 06/06/16 0346     06/06/16 0600  piperacillin-tazobactam (ZOSYN)  IVPB 3.375 g  Status:  Discontinued     3.375 g 100 mL/hr over 30 Minutes  Intravenous Every 8 hours 06/06/16 0232 06/06/16 0235   06/06/16 0600  piperacillin-tazobactam (ZOSYN) IVPB 3.375 g     3.375 g 12.5 mL/hr over 240 Minutes Intravenous Every 8 hours 06/06/16 0235     06/06/16 0030  vancomycin (VANCOCIN) 1,500 mg in sodium chloride 0.9 % 500 mL IVPB     1,500 mg 250 mL/hr over 120 Minutes Intravenous  Once 06/06/16 0020 06/06/16 0345   06/06/16 0030  piperacillin-tazobactam (ZOSYN) IVPB 3.375 g     3.375 g 100 mL/hr over 30 Minutes Intravenous  Once 06/06/16 0020 06/06/16 0131         Objective:   Filed Vitals:   06/06/16 0600 06/06/16 0700 06/06/16 0800 06/06/16 0852  BP: 154/79 141/76 143/68   Pulse: 96 92 91   Temp:   97.3 F (36.3 C)   TempSrc:   Oral   Resp: 15 15 20    Height:      Weight:      SpO2: 100% 98% 98% 92%    Wt Readings from Last 3 Encounters:  06/06/16 87.1 kg (192 lb 0.3 oz)  05/18/16 86.637 kg (191 lb)  11/03/15 91.8 kg (202 lb 6.1 oz)     Intake/Output Summary (Last 24 hours) at 06/06/16 0948 Last data filed at 06/06/16 0800  Gross per 24 hour  Intake    820 ml  Output    100 ml  Net    720 ml     Physical Exam  Somnolent, Moves all 4 extremities to painful stimuli and by himself [place].AT,PERRAL Supple Neck,No JVD, No cervical lymphadenopathy appriciated.  Symmetrical Chest wall movement, Good air movement bilaterally, minimal wheezing and rales RRR,No Gallops,Rubs or new Murmurs, No Parasternal Heave +ve B.Sounds, Abd Soft, No tenderness, No organomegaly appriciated, No rebound - guarding or rigidity. No Cyanosis, Clubbing or edema, No new Rash or bruise       Data Review:    CBC  Recent Labs Lab 06/06/16 0044 06/06/16 0055  WBC 3.5*  --   HGB 10.1* 10.9*  HCT 32.3* 32.0*  PLT 144*  --   MCV 92.0  --   MCH 28.8  --   MCHC 31.3  --   RDW 15.2  --   LYMPHSABS 0.4*  --   MONOABS 0.3  --   EOSABS 0.0  --   BASOSABS 0.0  --     Chemistries   Recent Labs Lab 06/06/16 0055  NA 136  K 4.0    CL 87*  GLUCOSE 116*  BUN 32*  CREATININE 1.00   ------------------------------------------------------------------------------------------------------------------ No results for input(s): CHOL, HDL, LDLCALC, TRIG, CHOLHDL, LDLDIRECT in the last 72 hours.  No results found for: HGBA1C ------------------------------------------------------------------------------------------------------------------ No results for input(s): TSH, T4TOTAL, T3FREE, THYROIDAB in the last 72 hours.  Invalid input(s): FREET3 ------------------------------------------------------------------------------------------------------------------ No results for input(s): VITAMINB12, FOLATE, FERRITIN, TIBC, IRON, RETICCTPCT in the last 72 hours.  Coagulation profile  Recent Labs Lab 06/06/16 0328  INR 0.93    No results for input(s): DDIMER in the last 72 hours.  Cardiac Enzymes No results for input(s): CKMB, TROPONINI, MYOGLOBIN in the last 168 hours.  Invalid input(s): CK ------------------------------------------------------------------------------------------------------------------    Component Value Date/Time   BNP 40.0 06/06/2016 0044    Micro Results Recent Results (from the past 240 hour(s))  MRSA PCR Screening     Status: None   Collection Time:  06/06/16  5:31 AM  Result Value Ref Range Status   MRSA by PCR NEGATIVE NEGATIVE Final    Comment:        The GeneXpert MRSA Assay (FDA approved for NASAL specimens only), is one component of a comprehensive MRSA colonization surveillance program. It is not intended to diagnose MRSA infection nor to guide or monitor treatment for MRSA infections.     Radiology Reports Dg Chest 2 View  06/06/2016  CLINICAL DATA:  Dyspnea. EXAM: CHEST  2 VIEW COMPARISON:  05/15/2016 FINDINGS: Persistent low lung volumes, with unchanged left hemidiaphragm elevation. Lung base opacities are present, left greater than right, worsened. This could be infectious. No  large effusions. IMPRESSION: Very low lung volumes. Lung base opacities have worsened and could be infectious. Electronically Signed   By: Andreas Newport M.D.   On: 06/06/2016 00:16   Dg Chest Port 1 View  06/06/2016  CLINICAL DATA:  Shortness of breath. EXAM: PORTABLE CHEST 1 VIEW COMPARISON:  June 05, 2016, May 15, 2016, and September 30, 2015 chest radiographs FINDINGS: Shallow degree of inspiration again noted. Mild elevation of the left hemidiaphragm is stable. There is fairly diffuse interstitial prominence throughout the lungs with patchy airspace opacity in the left base, stable. No new opacity is evident. Heart appears mildly enlarged with pulmonary vascularity overall within normal limits. No adenopathy evident. Mildly prominent bowel loops in the visualized upper abdomen remain. IMPRESSION: No appreciable change from 1 day prior. Generalized interstitial prominence is noted with apparent patchy airspace disease in the left base. These changes are stable compared to prior studies and likely are due to underlying fibrosis. No new opacity is evident compared to prior studies. Stable cardiac silhouette. Stable elevation left hemidiaphragm. Electronically Signed   By: Lowella Grip III M.D.   On: 06/06/2016 08:23   Dg Chest Port 1 View  05/15/2016  CLINICAL DATA:  Respiratory failure, CHF EXAM: PORTABLE CHEST 1 VIEW COMPARISON:  05/14/2016 FINDINGS: Continued marked elevation of the left hemidiaphragm. Diffuse bilateral airspace disease again noted with slight improvement. Suspect mild cardiomegaly. No visible effusions or acute bony abnormality. IMPRESSION: Continued low lung volumes with marked elevation of the left hemidiaphragm. Slight improvement in diffuse bilateral airspace disease. Electronically Signed   By: Rolm Baptise M.D.   On: 05/15/2016 07:14   Dg Chest Port 1 View  05/14/2016  CLINICAL DATA:  Fall during the night.  COPD. EXAM: PORTABLE CHEST 1 VIEW COMPARISON:  03/21/2016 FINDINGS:  Marked elevation of the left hemidiaphragm, progressed since prior study. Diffuse airspace opacities with cardiomegaly and vascular congestion. Findings likely reflect mild edema/ CHF. No visible effusions or pneumothorax. No acute bony abnormality. IMPRESSION: Marked elevation of the left hemidiaphragm. Diffuse bilateral airspace opacities with cardiomegaly and vascular congestion. Findings likely reflect CHF. Electronically Signed   By: Rolm Baptise M.D.   On: 05/14/2016 10:57    Time Spent in minutes  30   Marnesha Gagen K M.D on 06/06/2016 at 9:48 AM  Between 7am to 7pm - Pager - [phone]  After 7pm go to [email or web address] - password [email or web address]  [facility] -  Office  [phone]     \

## 2016-06-06 NOTE — ED Notes (Signed)
Patient has agreed to wear the 5-lead cardiac monitor.

## 2016-06-06 NOTE — Progress Notes (Signed)
Pharmacy Antibiotic Note  Joshua Conrad is a 70 y.o. male admitted on 06/05/2016 with pneumonia.  Pharmacy has been consulted for Vancomycin dosing.  First dose of antibiotics already given in ED.  Plan: Vancomycin 1gm IV every 8 hours.  Goal trough 15-20 mcg/mL. Zosyn 3.375g IV q8h (4 hour infusion).  Height: 6\' 2"  (188 cm) Weight: 192 lb 0.3 oz (87.1 kg) IBW/kg (Calculated) : 82.2  Temp (24hrs), Avg:97.4 F (36.3 C), Min:96.8 F (36 C), Max:97.9 F (36.6 C)   Recent Labs Lab 06/06/16 0044 06/06/16 0054 06/06/16 0055  WBC 3.5*  --   --   CREATININE  --   --  1.00  LATICACIDVEN  --  0.94  --     Estimated Creatinine Clearance: 81.1 mL/min (by C-G formula based on Cr of 1).    Allergies  Allergen Reactions  . Prednisone     Caused pt to go crazy     Antimicrobials this admission: Vancomycin 06/06/2016 >> Zosyn 06/06/2016 >>   Dose adjustments this admission: -  Microbiology results: pending  Thank you for allowing pharmacy to be a part of this patient's care.  Nani Skillern Crowford 06/06/2016 3:46 AM

## 2016-06-06 NOTE — ED Notes (Signed)
Joshua Conrad, NT notified myself that patient had removed himself from his Bipap machine and cardiac monitor. IV was still intact. Pt was instructing he wanted to leave. Informed Dr. Suzie Portela about patients actions and he came to bedside. Bi-pap was not reconnected per provider instructions. Pt was placed back on 6L via [place]. He is refusing to be placed back on cardiac monitor. Assisted patient with Joshua Conrad, NT to reposition pt back on stretcher. Also, notified Tabathia, RT of patients situation.

## 2016-06-06 NOTE — Progress Notes (Signed)
Cancelled ABG - PT alert and orientated to date, time, place and President. Currently on 2 lpm [place]- Sp02 97%. RN aware.

## 2016-06-06 NOTE — ED Notes (Signed)
Vallarie Mare, NT; [place], [place]; and [place], RN attempted to obtain blood samples but was unsuccessful. Will call main lab to draw blood samples.

## 2016-06-06 NOTE — Progress Notes (Signed)
OT Cancellation Note  Patient Details Name: Joshua Conrad MRN: [id number] DOB: 1946/07/11   Cancelled Treatment:    Reason Eval/Treat Not Completed: Other (comment) -- Patient is from SNF and plan is to return there at discharge. No immediate acute OT needs apparent at this time. Will defer all OT needs to SNF. Thank you.  Luigi Stuckey A 06/06/2016, 8:13 AM

## 2016-06-06 NOTE — ED Notes (Signed)
Spoke with ICU charge nurse, 20 minute timer starts. Will give ATIVAN before arriving to unit.

## 2016-06-06 NOTE — ED Notes (Signed)
Phlebotomy at bedside.

## 2016-06-07 DIAGNOSIS — D61818 Other pancytopenia: Secondary | ICD-10-CM | POA: Diagnosis present

## 2016-06-07 LAB — BASIC METABOLIC PANEL
Anion gap: 3 — ABNORMAL LOW (ref 5–15)
BUN: 30 mg/dL — AB (ref 6–20)
CALCIUM: 9.1 mg/dL (ref 8.9–10.3)
CHLORIDE: 93 mmol/L — AB (ref 101–111)
CO2: 41 mmol/L — ABNORMAL HIGH (ref 22–32)
CREATININE: 0.99 mg/dL (ref 0.61–1.24)
Glucose, Bld: 180 mg/dL — ABNORMAL HIGH (ref 65–99)
Potassium: 4.6 mmol/L (ref 3.5–5.1)
SODIUM: 137 mmol/L (ref 135–145)

## 2016-06-07 LAB — CBC WITH DIFFERENTIAL/PLATELET
BASOS ABS: 0 10*3/uL (ref 0.0–0.1)
Basophils Relative: 0 %
EOS ABS: 0 10*3/uL (ref 0.0–0.7)
Eosinophils Relative: 0 %
HCT: 26.3 % — ABNORMAL LOW (ref 39.0–52.0)
Hemoglobin: 8.2 g/dL — ABNORMAL LOW (ref 13.0–17.0)
LYMPHS ABS: 0.2 10*3/uL — AB (ref 0.7–4.0)
Lymphocytes Relative: 24 %
MCH: 28.5 pg (ref 26.0–34.0)
MCHC: 31.2 g/dL (ref 30.0–36.0)
MCV: 91.3 fL (ref 78.0–100.0)
MONO ABS: 0 10*3/uL — AB (ref 0.1–1.0)
Monocytes Relative: 3 %
NEUTROS PCT: 73 %
Neutro Abs: 0.8 10*3/uL — ABNORMAL LOW (ref 1.7–7.7)
PLATELETS: 137 10*3/uL — AB (ref 150–400)
RBC: 2.88 MIL/uL — AB (ref 4.22–5.81)
RDW: 14.9 % (ref 11.5–15.5)
WBC: 1 10*3/uL — AB (ref 4.0–10.5)

## 2016-06-07 LAB — CBC
HCT: 27.9 % — ABNORMAL LOW (ref 39.0–52.0)
HEMOGLOBIN: 8.7 g/dL — AB (ref 13.0–17.0)
MCH: 28.7 pg (ref 26.0–34.0)
MCHC: 31.2 g/dL (ref 30.0–36.0)
MCV: 92.1 fL (ref 78.0–100.0)
PLATELETS: 139 10*3/uL — AB (ref 150–400)
RBC: 3.03 MIL/uL — AB (ref 4.22–5.81)
RDW: 15 % (ref 11.5–15.5)
WBC: 1 10*3/uL — CL (ref 4.0–10.5)

## 2016-06-07 NOTE — Care Management Note (Signed)
Case Management Note  Patient Details  Name: Lowe Mcbratney MRN: [id number] Date of Birth: November 22, 1946  Subjective/Objective: 70 y/o m admitted w/PNA,CHF. Hx: DNR,advanced pulmonary fibrosis. From home.  Palliative noted.PT-cons-await recc. ST-dysphagia diet.                 Action/Plan:d/c plan home.   Expected Discharge Date:                  Expected Discharge Plan:  Home/Self Care  In-House Referral:  Clinical Social Work  Discharge planning Services  CM Consult  Post Acute Care Choice:    Choice offered to:     DME Arranged:    DME Agency:     HH Arranged:    HH Agency:     Status of Service:  In process, will continue to follow  If discussed at Long Length of Stay Meetings, dates discussed:    Additional Comments:  Dessa Phi, RN 06/07/2016, 1:31 PM

## 2016-06-07 NOTE — Progress Notes (Signed)
PT Cancellation Note  Patient Details Name: Joshua Conrad MRN: [id number] DOB: 04-28-1946   Cancelled Treatment:    Reason Eval/Treat Not Completed: Patient declined, no reason specified. Will check back another day.    Weston Anna, MPT Pager: [phone]

## 2016-06-07 NOTE — Evaluation (Signed)
Clinical/Bedside Swallow Evaluation Patient Details  Name: Joshua Conrad MRN: [id number] Date of Birth: 16-Sep-1946  Today's Date: 06/07/2016 Time: SLP Start Time (ACUTE ONLY): 0841 SLP Stop Time (ACUTE ONLY): 0859 SLP Time Calculation (min) (ACUTE ONLY): 18 min  Past Medical History:  Past Medical History  Diagnosis Date  . Pulmonary fibrosis ([place])   . MI (mitral incompetence)   . Hypertension   . Hyperlipidemia   . Coronary artery disease   . CHF (congestive heart failure) ([place])   . COPD (chronic obstructive pulmonary disease) ([place])   . Kaposi sarcoma ([place])   . OSA (obstructive sleep apnea)   . Diaphragm paralysis     Left hemidiaphragm  . Gout    Past Surgical History:  Past Surgical History  Procedure Laterality Date  . Coronary angioplasty with stent placement    . Cataract extraction     HPI:  70 yo male adm to [facility] with respiratory issues.  PMH + for esophageal narrowing diagnosed many years ago per pt, COPD, hospitalization 6/4-6/7 due to acute on chronic respiratory failure, which was likely due to acute diastolic CHF per Md note, with underlying pulmonary fibrosis and left diaphragmatic paralysis.  Swallow evaluation ordered and palliative care consult underway with plan to meet Thursday per chart review.  Swallow eval ordered.    Assessment / Plan / Recommendation Clinical Impression  Pt presents with functional oropharyngeal swallow based on clinical swallow evaluation.  No indication of aspiration with po intake however he did desat midlly when talking with SLP and eating.   CN exam unremarkable and pt with strong voice today - feeding himself!  Recommend continue soft/thin diet due to pt's lack of upper dentition and for energy conservation.    Pt has h/o esophageal narrowing per endscopy many years ago per pt.  He took medications to help manage his symptoms but did not recall if he was dilated and currenlty denies symptoms consistent with prior to endoscopy.     Educated pt to general aspiration precautions given his diaphgramatic paralysis, pulmonary fibrosis contributing to pna risk if pt does aspirate.  Mr Butkovich admits that he eats and lays down soon after at times, SLP advised strongly against this behavior.  SLP to sign off, thanks for this order.     Aspiration Risk  Mild aspiration risk (due to respiratory disease and h/o esoph dysphagia)    Diet Recommendation Dysphagia 3 (Mech soft);Thin liquid   Medication Administration: Other (Comment) (as tolerated) Supervision: Patient able to self feed Compensations: Slow rate;Small sips/bites    Other  Recommendations Oral Care Recommendations: Oral care BID   Follow up Recommendations    n/a   Frequency and Duration     n/a       Prognosis   chronic risk due to COPD, esophageal hx     Swallow Study   General Date of Onset: 06/07/16 HPI: 70 yo male adm to [facility] with respiratory issues.  PMH + for esophageal narrowing diagnosed many years ago per pt, COPD, hospitalization 6/4-6/7 due to acute on chronic respiratory failure, which was likely due to acute diastolic CHF per Md note, with underlying pulmonary fibrosis and left diaphragmatic paralysis.  Swallow evaluation ordered and palliative care consult underway with plan to meet Thursday per chart review.  Swallow eval ordered.  Type of Study: Bedside Swallow Evaluation Diet Prior to this Study: Dysphagia 3 (soft);Thin liquids Temperature Spikes Noted: No Respiratory Status: Room air History of Recent Intubation: No Behavior/Cognition: Alert;Cooperative;Pleasant mood Oral  Care Completed by SLP: No Oral Cavity - Dentition: Other (Comment);Missing dentition (lower anterior dentition, no uppers) Vision: Functional for self-feeding Self-Feeding Abilities: Able to feed self Patient Positioning: Upright in bed Baseline Vocal Quality: Normal Volitional Cough: Weak (pt reports cough is stronger today than yesterday and his cough may be  productive later in the day) Volitional Swallow: Able to elicit    Oral/Motor/Sensory Function Overall Oral Motor/Sensory Function: Generalized oral weakness   Ice Chips Ice chips: Not tested   Thin Liquid Thin Liquid: Within functional limits Presentation: Straw    Nectar Thick Nectar Thick Liquid: Not tested   Honey Thick Honey Thick Liquid: Not tested   Puree Puree: Not tested   Solid   GO   Presentation: Self Fed;Spoon Other Comments: soft        Luanna Salk, [place] [facility] SLP [phone]

## 2016-06-07 NOTE — Progress Notes (Signed)
Patient received as transfer from ICU.  Patient stable at time of transfer.  Agree with previous RN's assessment of patient.  Have placed and confirmed tele with CMT and oriented patient to unit and equipment.  Will continue to monitor patient.

## 2016-06-07 NOTE — Progress Notes (Addendum)
CRITICAL VALUE ALERT  Critical value received:  WBC 1.0  Date of notification:  06/07/2016  Time of notification:  [id number]  Critical value read back:Yes.    Nurse who received alert:  Janell Quiet   MD notified (1st page):  K.Schorr  Time of first page:  [phone]  MD notified (2nd page):  Time of second page:  Responding MD:    Time MD responded:

## 2016-06-07 NOTE — Progress Notes (Signed)
PROGRESS NOTE                                                                                                                                                                                                             Patient Demographics:    Joshua Conrad, is a 70 y.o. male, DOB - 01-06-1946, [id number]  Admit date - 06/05/2016   Admitting Physician Ivor Costa, MD  Outpatient Primary MD for the patient is Sandi Mariscal, MD  LOS - 1  Chief Complaint  Patient presents with  . Shortness of Breath       Brief Narrative    Joshua Conrad is a 70 y.o. male with medical history significant of chronic respiratory failure on 5L oxygen and BiPAP at night at home, pulmonary fibrosis, COPD, Kaposi's sarcoma, OSA, left diaphragm paralysis, dCHF, CAD, s/p of Stent placement, gout, hyperlipidemia, hypertension, who presents with SOB and cough.  Patient was recently hospitalized from 6/4-6/7 due to acute on chronic respiratory failure, which was likely due to acute diastolic CHF, with underlying pulmonary fibrosis and left diaphragmatic paralysis. Pt was discharged to SNF at a stable condition. Pt is using Bipap at night and 5 L of O2 at home.   Pt states that his SOB has been worsening since yesterday. He Has productive cough, but no fever, chills. He has mild generalized chest pain when he coughs. He cannot speak in full sentence. He denies nausea, vomiting, diarrhea, abdominal pain. No unilateral weakness. Denies symptoms of UTI. Patient is DNR  ED Course: pt was found to have WBC 3.5, tachycardia, tachypnea, lactate 0.94, oxygen desaturated to 77% with 5 L of nasal canula O2, temperature normal. CXR showed very low lung volume, lung base opacities have worsened. Pt is admitted to stepdown bed for further evaluation and treatment.    Subjective:    Joshua Conrad today Is somnolent, does not follow commands or answer questions reliably upon my  exam.   Assessment  & Plan :     1.Pneumonia in a patient with advanced pulmonary fibrosis and COPD on 5-6 L nasal cannula oxygen at baseline and nighttime BiPAP.  Do not think he was septic, afebrile, lactate normal, pro-calcitonin normal, chest x-ray shows worsening bilateral infiltrates, BNP stable, on D1 diet  and speech following, follow cultures, continue empiric antibiotics which are vancomycin and Zosyn  for now.  2. Obstructive sleep apnea, Advanced pulmonary fibrosis and COPD. With acute on chronic hypoxic and hypercapnic respiratory failure due to combination of pneumonia above and possibly mild COPD exacerbation with evidence of hypercapnia and acute on chronic respiratory acidosis.   Plan as above, BiPAP as needed in day and scheduled at nighttime, it is noncompliant with nighttime BiPAP and has been counseled, nebulizer treatments and oxygen to continue, keep pulse ox around 90, IV Solu-Medrol. Once he is better he'll follow with his pulmonologist at [facility].  Today he appears to be close to his baseline.   3. Chronic diastolic CHF with EF [id number]. Appears compensated. BNP stable. Resumed home dose diuretic.  4. Chronic iron deficiency anemia. On oral and supplementation continue.  5. Gout. On allopurinol.  6. History of Kaposi's sarcoma. Treated with radiation/chemotherapy per patient at [place] last 1 month ago. Outpatient follow-up.  7. Generalized deconditioning along with severe malnutrition. PT and nutritional supplements. Long-term prognosis guarded. Have called palliative care.  8. Hypoxic and hypercapnic encephalopathy. Treatment as in #  1 and 2 above. Resolved now at baseline.  9. Dyslipidemia. On statin continue.  10. CAD. Status post and placement. No chest pain, no acute issues. Continue statin for secondary prevention.  11. Pancytopenia - chronic, will monitor diff, last chemo at [facility] > 61mth ago.    Code Status :  DNR  Family Communication  :  None  present  Disposition Plan  :  Move to the floor telemetry bed, PT eval  Consults  :  Pall Care  Diet : D-3  Procedures  :   DVT Prophylaxis  :  Lovenox   Lab Results  Component Value Date   PLT 137* 06/07/2016    Inpatient Medications  Scheduled Meds: . allopurinol  300 mg Oral Daily  . bumetanide  1.5 mg Oral Daily  . cholecalciferol  400 Units Oral Daily  . dextromethorphan-guaiFENesin  1 tablet Oral BID  . enoxaparin (LOVENOX) injection  40 mg Subcutaneous Q24H  . famotidine  20 mg Oral QHS  . feeding supplement (ENSURE ENLIVE)  237 mL Oral BID BM  . ferrous sulfate  325 mg Oral BID  . fluticasone  2 spray Each Nare QHS  . ipratropium  0.5 mg Nebulization Q6H  . levalbuterol  1.25 mg Nebulization Q6H  . LORazepam  0.25 mg Intravenous Once  . losartan  50 mg Oral Daily  . methylPREDNISolone (SOLU-MEDROL) injection  60 mg Intravenous TID  . montelukast  10 mg Oral QHS  . mycophenolate  1,500 mg Oral BID  . piperacillin-tazobactam (ZOSYN)  IV  3.375 g Intravenous Q8H  . potassium chloride  20 mEq Oral Daily  . simvastatin  5 mg Oral QHS  . vancomycin  1,000 mg Intravenous Q8H  . vitamin C  500 mg Oral Daily   Continuous Infusions:  PRN Meds:.  Antibiotics  :    Anti-infectives    Start     Dose/Rate Route Frequency Ordered Stop   06/06/16 1400  vancomycin (VANCOCIN) IVPB 1000 mg/200 mL premix     1,000 mg 200 mL/hr over 60 Minutes Intravenous Every 8 hours 06/06/16 0346     06/06/16 0600  piperacillin-tazobactam (ZOSYN) IVPB 3.375 g  Status:  Discontinued     3.375 g 100 mL/hr over 30 Minutes Intravenous Every 8 hours 06/06/16 0232 06/06/16 0235   06/06/16 0600  piperacillin-tazobactam (ZOSYN) IVPB 3.375 g     3.375 g 12.5 mL/hr over 240 Minutes Intravenous  Every 8 hours 06/06/16 0235     06/06/16 0030  vancomycin (VANCOCIN) 1,500 mg in sodium chloride 0.9 % 500 mL IVPB     1,500 mg 250 mL/hr over 120 Minutes Intravenous  Once 06/06/16 0020 06/06/16 0345    06/06/16 0030  piperacillin-tazobactam (ZOSYN) IVPB 3.375 g     3.375 g 100 mL/hr over 30 Minutes Intravenous  Once 06/06/16 0020 06/06/16 0131         Objective:   Filed Vitals:   06/07/16 0150 06/07/16 0400 06/07/16 0800 06/07/16 0925  BP: 127/55     Pulse:      Temp:  98.7 F (37.1 C) 98.6 F (37 C)   TempSrc:  Oral Oral   Resp: 23     Height:      Weight:      SpO2: 89%   100%    Wt Readings from Last 3 Encounters:  06/07/16 89.4 kg (197 lb 1.5 oz)  05/18/16 86.637 kg (191 lb)  11/03/15 91.8 kg (202 lb 6.1 oz)     Intake/Output Summary (Last 24 hours) at 06/07/16 1005 Last data filed at 06/07/16 0700  Gross per 24 hour  Intake   1420 ml  Output   1650 ml  Net   -230 ml     Physical Exam  Somnolent, Moves all 4 extremities to painful stimuli and by himself [place].AT,PERRAL Supple Neck,No JVD, No cervical lymphadenopathy appriciated.  Symmetrical Chest wall movement, Good air movement bilaterally, minimal wheezing and rales RRR,No Gallops,Rubs or new Murmurs, No Parasternal Heave +ve B.Sounds, Abd Soft, No tenderness, No organomegaly appriciated, No rebound - guarding or rigidity. No Cyanosis, Clubbing or edema, No new Rash or bruise       Data Review:    CBC  Recent Labs Lab 06/06/16 0044 06/06/16 0055 06/07/16 0308 06/07/16 0757  WBC 3.5*  --  1.0* 1.0*  HGB 10.1* 10.9* 8.7* 8.2*  HCT 32.3* 32.0* 27.9* 26.3*  PLT 144*  --  139* 137*  MCV 92.0  --  92.1 91.3  MCH 28.8  --  28.7 28.5  MCHC 31.3  --  31.2 31.2  RDW 15.2  --  15.0 14.9  LYMPHSABS 0.4*  --   --  0.2*  MONOABS 0.3  --   --  0.0*  EOSABS 0.0  --   --  0.0  BASOSABS 0.0  --   --  0.0    Chemistries   Recent Labs Lab 06/06/16 0055 06/07/16 0308  NA 136 137  K 4.0 4.6  CL 87* 93*  CO2  --  41*  GLUCOSE 116* 180*  BUN 32* 30*  CREATININE 1.00 0.99  CALCIUM  --  9.1    ------------------------------------------------------------------------------------------------------------------ No results for input(s): CHOL, HDL, LDLCALC, TRIG, CHOLHDL, LDLDIRECT in the last 72 hours.  No results found for: HGBA1C ------------------------------------------------------------------------------------------------------------------ No results for input(s): TSH, T4TOTAL, T3FREE, THYROIDAB in the last 72 hours.  Invalid input(s): FREET3 ------------------------------------------------------------------------------------------------------------------ No results for input(s): VITAMINB12, FOLATE, FERRITIN, TIBC, IRON, RETICCTPCT in the last 72 hours.  Coagulation profile  Recent Labs Lab 06/06/16 0328  INR 0.93    No results for input(s): DDIMER in the last 72 hours.  Cardiac Enzymes No results for input(s): CKMB, TROPONINI, MYOGLOBIN in the last 168 hours.  Invalid input(s): CK ------------------------------------------------------------------------------------------------------------------    Component Value Date/Time   BNP 40.0 06/06/2016 0044    Micro Results Recent Results (from the past 240 hour(s))  Respiratory Panel by PCR  Status: None   Collection Time: 06/06/16  4:44 AM  Result Value Ref Range Status   Adenovirus NOT DETECTED NOT DETECTED Final   Coronavirus 229E NOT DETECTED NOT DETECTED Final   Coronavirus HKU1 NOT DETECTED NOT DETECTED Final   Coronavirus NL63 NOT DETECTED NOT DETECTED Final   Coronavirus OC43 NOT DETECTED NOT DETECTED Final   Metapneumovirus NOT DETECTED NOT DETECTED Final   Rhinovirus / Enterovirus NOT DETECTED NOT DETECTED Final   Influenza A NOT DETECTED NOT DETECTED Final   Influenza A H1 NOT DETECTED NOT DETECTED Final   Influenza A H1 2009 NOT DETECTED NOT DETECTED Final   Influenza A H3 NOT DETECTED NOT DETECTED Final   Influenza B NOT DETECTED NOT DETECTED Final   Parainfluenza Virus 1 NOT DETECTED NOT  DETECTED Final   Parainfluenza Virus 2 NOT DETECTED NOT DETECTED Final   Parainfluenza Virus 3 NOT DETECTED NOT DETECTED Final   Parainfluenza Virus 4 NOT DETECTED NOT DETECTED Final   Respiratory Syncytial Virus NOT DETECTED NOT DETECTED Final   Bordetella pertussis NOT DETECTED NOT DETECTED Final   Chlamydophila pneumoniae NOT DETECTED NOT DETECTED Final   Mycoplasma pneumoniae NOT DETECTED NOT DETECTED Final  MRSA PCR Screening     Status: None   Collection Time: 06/06/16  5:31 AM  Result Value Ref Range Status   MRSA by PCR NEGATIVE NEGATIVE Final    Comment:        The GeneXpert MRSA Assay (FDA approved for NASAL specimens only), is one component of a comprehensive MRSA colonization surveillance program. It is not intended to diagnose MRSA infection nor to guide or monitor treatment for MRSA infections.     Radiology Reports Dg Chest 2 View  06/06/2016  CLINICAL DATA:  Dyspnea. EXAM: CHEST  2 VIEW COMPARISON:  05/15/2016 FINDINGS: Persistent low lung volumes, with unchanged left hemidiaphragm elevation. Lung base opacities are present, left greater than right, worsened. This could be infectious. No large effusions. IMPRESSION: Very low lung volumes. Lung base opacities have worsened and could be infectious. Electronically Signed   By: Andreas Newport M.D.   On: 06/06/2016 00:16   Dg Chest Port 1 View  06/06/2016  CLINICAL DATA:  Shortness of breath. EXAM: PORTABLE CHEST 1 VIEW COMPARISON:  June 05, 2016, May 15, 2016, and September 30, 2015 chest radiographs FINDINGS: Shallow degree of inspiration again noted. Mild elevation of the left hemidiaphragm is stable. There is fairly diffuse interstitial prominence throughout the lungs with patchy airspace opacity in the left base, stable. No new opacity is evident. Heart appears mildly enlarged with pulmonary vascularity overall within normal limits. No adenopathy evident. Mildly prominent bowel loops in the visualized upper abdomen  remain. IMPRESSION: No appreciable change from 1 day prior. Generalized interstitial prominence is noted with apparent patchy airspace disease in the left base. These changes are stable compared to prior studies and likely are due to underlying fibrosis. No new opacity is evident compared to prior studies. Stable cardiac silhouette. Stable elevation left hemidiaphragm. Electronically Signed   By: Lowella Grip III M.D.   On: 06/06/2016 08:23   Dg Chest Port 1 View  05/15/2016  CLINICAL DATA:  Respiratory failure, CHF EXAM: PORTABLE CHEST 1 VIEW COMPARISON:  05/14/2016 FINDINGS: Continued marked elevation of the left hemidiaphragm. Diffuse bilateral airspace disease again noted with slight improvement. Suspect mild cardiomegaly. No visible effusions or acute bony abnormality. IMPRESSION: Continued low lung volumes with marked elevation of the left hemidiaphragm. Slight improvement in diffuse bilateral airspace  disease. Electronically Signed   By: Rolm Baptise M.D.   On: 05/15/2016 07:14   Dg Chest Port 1 View  05/14/2016  CLINICAL DATA:  Fall during the night.  COPD. EXAM: PORTABLE CHEST 1 VIEW COMPARISON:  03/21/2016 FINDINGS: Marked elevation of the left hemidiaphragm, progressed since prior study. Diffuse airspace opacities with cardiomegaly and vascular congestion. Findings likely reflect mild edema/ CHF. No visible effusions or pneumothorax. No acute bony abnormality. IMPRESSION: Marked elevation of the left hemidiaphragm. Diffuse bilateral airspace opacities with cardiomegaly and vascular congestion. Findings likely reflect CHF. Electronically Signed   By: Rolm Baptise M.D.   On: 05/14/2016 10:57    Time Spent in minutes  30   SINGH,PRASHANT K M.D on 06/07/2016 at 10:05 AM  Between 7am to 7pm - Pager - [phone]  After 7pm go to [email or web address] - password [facility]  [facility] -  Office  [phone]     \

## 2016-06-07 NOTE — Progress Notes (Signed)
RT has come to place patient on CPAP. Patient is not ready. Nurse will call when patient is ready to go on.

## 2016-06-08 DIAGNOSIS — Z515 Encounter for palliative care: Secondary | ICD-10-CM

## 2016-06-08 DIAGNOSIS — R0602 Shortness of breath: Secondary | ICD-10-CM

## 2016-06-08 LAB — CBC WITH DIFFERENTIAL/PLATELET
BASOS ABS: 0 10*3/uL (ref 0.0–0.1)
Basophils Relative: 3 %
EOS PCT: 0 %
Eosinophils Absolute: 0 10*3/uL (ref 0.0–0.7)
HEMATOCRIT: 30 % — AB (ref 39.0–52.0)
HEMOGLOBIN: 9.2 g/dL — AB (ref 13.0–17.0)
LYMPHS ABS: 0.2 10*3/uL — AB (ref 0.7–4.0)
LYMPHS PCT: 14 %
MCH: 28.8 pg (ref 26.0–34.0)
MCHC: 30.7 g/dL (ref 30.0–36.0)
MCV: 94 fL (ref 78.0–100.0)
MONOS PCT: 4 %
Monocytes Absolute: 0.1 10*3/uL (ref 0.1–1.0)
NEUTROS ABS: 1.3 10*3/uL — AB (ref 1.7–7.7)
Neutrophils Relative %: 79 %
Platelets: 160 10*3/uL (ref 150–400)
RBC: 3.19 MIL/uL — ABNORMAL LOW (ref 4.22–5.81)
RDW: 15 % (ref 11.5–15.5)
WBC: 1.6 10*3/uL — ABNORMAL LOW (ref 4.0–10.5)

## 2016-06-08 LAB — LEGIONELLA PNEUMOPHILA SEROGP 1 UR AG: L. PNEUMOPHILA SEROGP 1 UR AG: NEGATIVE

## 2016-06-08 LAB — VANCOMYCIN, TROUGH: Vancomycin Tr: 51 ug/mL (ref 15–20)

## 2016-06-08 MED ORDER — LEVOFLOXACIN 750 MG PO TABS
750.0000 mg | ORAL_TABLET | Freq: Every day | ORAL | Status: DC
  Administered 2016-06-08 – 2016-06-09 (×2): 750 mg via ORAL
  Filled 2016-06-08 (×2): qty 1

## 2016-06-08 MED ORDER — METHYLPREDNISOLONE SODIUM SUCC 125 MG IJ SOLR: 60.0000 mg | Freq: Two times a day (BID) | INTRAMUSCULAR | Status: DC

## 2016-06-08 NOTE — Consult Note (Signed)
Consultation Note Date: 06/08/2016   Patient Name: Joshua Conrad  DOB: 1946-08-11  MRN: [id number]  Age / Sex: 70 y.o., male  PCP: Sandi Mariscal, MD Referring Physician: Thurnell Lose, MD  Reason for Consultation: Establishing goals of care  HPI/Patient Profile: 70 y.o. male  with past medical history of chronic respiratory failure on 5L oxygen and BiPAP at night at home, severe pulmonary fibrosis (taking mycophenolate), COPD, Kaposi's sarcoma, OSA, left diaphragm paralysis, diastolic CHF, CAD s/p stent placement, gout, hyperlipidemia, hypertension, who presents with SOB and cough. He was recently hospitalized from 6/4-6/7 due to acute on chronic respiratory failure, which was likely due to acute diastolic CHF, with underlying pulmonary fibrosis and left diaphragmatic paralysis. Pt was discharged to SNF at a stable condition. Pt is using Bipap at night and 5 L of O2 at home. He was admitted on 06/05/2016 with worsening SOB and cough and is being treated for HCAP.   Clinical Assessment and Goals of Care: I met today with Joshua Conrad and his sister-in-law Joshua Conrad (however clarified during conversation that official Joshua Conrad is friend Joshua Conrad but they both insist that they talk together and make decisions together and Joshua Conrad is not local). We had a long discussion today regarding Joshua Conrad poor QOL, worsening pulmonary disease, and his goals. He acknowledges very poor QOL and that he knows with his disease progression this is unlikely to improve. However he does allude to getting out and going to dinner and is slightly confused and times throughout conversation but does grasp his overall prognosis. He also seems somewhat paranoid talking of other residents at [facility] that he fears talk about him and will try and cause trouble by accusing him of things he did not do (also saying these individuals are across the hall  now). Joshua Conrad says his confusion has been present with his decline over the past 1-2 months.   After discussion Joshua Conrad seems somewhat torn on whether he would like to come back to the hospital. He does seem to like the option of focusing on comfort and they would like the assistance of hospice to help with comfort if he declines again (althought they may consider hospitalization if they believe that he may have some improvement). We will also add rescue doses of anxiety and low dose opioid for severe periods of SOB that may need assistance from outpatient palliative services to further manage comfort meds, discuss GOC at SNF, as well as help with transition to hospice once rehab benefit is done. They have met with Joshua Chang, NP with [facility] palliative services and would like to follow up with him if possible.   Philosophy and benefits of hospice and comfort care were discussed. He requests Joshua Conrad's help with having a better schedule at SNF as he finds comfort in consistency and knowing what to anticipate in his day. Emotional support provided.   Joshua Conrad not present for discussion but Joshua Conrad making most his own decisions at this point    SUMMARY OF  RECOMMENDATIONS   - Return to [facility] - Transition to hospice once rehab benefit done - PRN rescue meds for SOB/anxiety  Code Status/Advance Care Planning:  DNR   Symptom Management:   Dyspnea: Recommend low dose roxanol for 2.5 mg TID prn for SOB episodes.   Anxiety: Recommend Xanax 0.25 mg daily prn for anxiety episodes. May be increased but he says this does not even happen daily.   Palliative Prophylaxis:   Bowel Regimen, Delirium Protocol and Turn Reposition  Additional Recommendations (Limitations, Scope, Preferences):  Avoid Hospitalization  Psycho-social/Spiritual:   Desire for further Chaplaincy support:no  Additional Recommendations: Caregiving  Support/Resources and Education on Hospice  Prognosis:   < 6  months  Discharge Planning: [facility] for rehab with Palliative care service follow-up      Primary Diagnoses: Present on Admission:  . HCAP (healthcare-associated pneumonia) . COPD (chronic obstructive pulmonary disease) ([place]) . CAD (coronary artery disease) . OSA (obstructive sleep apnea) . Chronic diastolic CHF (congestive heart failure) ([place]) . Chronic anemia . Acute on chronic respiratory failure with hypoxia and hypercapnia ([facility]) . Hyperlipidemia . Pulmonary fibrosis ([place]) . Diaphragm paralysis . Acute on chronic respiratory failure with hypoxia ([place]) . Gout . Protein-calorie malnutrition, severe ([place]) . Pancytopenia ([place])  I have reviewed the medical record, interviewed the patient and family, and examined the patient. The following aspects are pertinent.  Past Medical History  Diagnosis Date  . Pulmonary fibrosis ([place])   . MI (mitral incompetence)   . Hypertension   . Hyperlipidemia   . Coronary artery disease   . CHF (congestive heart failure) ([place])   . COPD (chronic obstructive pulmonary disease) ([place])   . Kaposi sarcoma ([place])   . OSA (obstructive sleep apnea)   . Diaphragm paralysis     Left hemidiaphragm  . Gout    Social History   Social History  . Marital Status: Widowed    Spouse Name: N/A  . Number of Children: N/A  . Years of Education: N/A   Social History Main Topics  . Smoking status: Former Research scientist (life sciences)  . Smokeless tobacco: None  . Alcohol Use: No     Comment: Quit 8 years ago.  . Drug Use: None  . Sexual Activity: Not Asked   Other Topics Concern  . None   Social History Narrative   Family History  Problem Relation Age of Onset  . Hypertension Mother   . Hypertension Father    Scheduled Meds: . allopurinol  300 mg Oral Daily  . bumetanide  1.5 mg Oral Daily  . cholecalciferol  400 Units Oral Daily  . dextromethorphan-guaiFENesin  1 tablet Oral BID  . enoxaparin (LOVENOX) injection  40 mg Subcutaneous Q24H  .  famotidine  20 mg Oral QHS  . feeding supplement (ENSURE ENLIVE)  237 mL Oral BID BM  . ferrous sulfate  325 mg Oral BID  . fluticasone  2 spray Each Nare QHS  . ipratropium  0.5 mg Nebulization Q6H  . levalbuterol  1.25 mg Nebulization Q6H  . LORazepam  0.25 mg Intravenous Once  . losartan  50 mg Oral Daily  . methylPREDNISolone (SOLU-MEDROL) injection  60 mg Intravenous TID  . montelukast  10 mg Oral QHS  . mycophenolate  1,500 mg Oral BID  . piperacillin-tazobactam (ZOSYN)  IV  3.375 g Intravenous Q8H  . potassium chloride  20 mEq Oral Daily  . simvastatin  5 mg Oral QHS  . vancomycin  1,000 mg Intravenous Q8H  .  vitamin C  500 mg Oral Daily   Continuous Infusions:  PRN Meds:. Medications Prior to Admission:  Prior to Admission medications   Medication Sig Start Date End Date Taking? Authorizing Provider  albuterol (PROVENTIL HFA;VENTOLIN HFA) 108 (90 BASE) MCG/ACT inhaler Inhale 2 puffs into the lungs every 6 (six) hours as needed for wheezing or shortness of breath.   Yes Historical Provider, MD  albuterol (PROVENTIL) (2.5 MG/3ML) 0.083% nebulizer solution Take 2.5 mg by nebulization every 4 (four) hours as needed for wheezing or shortness of breath.   Yes Historical Provider, MD  allopurinol (ZYLOPRIM) 300 MG tablet Take 300 mg by mouth daily.   Yes Historical Provider, MD  bumetanide (BUMEX) 1 MG tablet Take 1.5 tablets (1.5 mg total) by mouth daily. 05/17/16  Yes Shanker Kristeen Mans, MD  cholecalciferol (VITAMIN D) 400 UNITS TABS tablet Take 400 Units by mouth daily.   Yes Historical Provider, MD  feeding supplement, ENSURE ENLIVE, (ENSURE ENLIVE) LIQD Take 237 mLs by mouth 2 (two) times daily between meals. 05/17/16  Yes Shanker Kristeen Mans, MD  ferrous sulfate 325 (65 FE) MG tablet Take 325 mg by mouth 2 (two) times daily.   Yes Historical Provider, MD  fluticasone (FLONASE) 50 MCG/ACT nasal spray Place 2 sprays into both nostrils at bedtime.   Yes Historical Provider, MD  losartan  (COZAAR) 50 MG tablet Take 50 mg by mouth daily.   Yes Historical Provider, MD  montelukast (SINGULAIR) 10 MG tablet Take 10 mg by mouth at bedtime.   Yes Historical Provider, MD  mycophenolate (CELLCEPT) 500 MG tablet Take 1,500 mg by mouth 2 (two) times daily.   Yes Historical Provider, MD  potassium chloride (K-DUR) 10 MEQ tablet Take 2 tablets (20 mEq total) by mouth daily. 05/17/16  Yes Shanker Kristeen Mans, MD  ranitidine (ZANTAC) 150 MG tablet Take 150 mg by mouth 2 (two) times daily.   Yes Historical Provider, MD  simvastatin (ZOCOR) 5 MG tablet Take 5 mg by mouth at bedtime.   Yes Historical Provider, MD  vitamin C (ASCORBIC ACID) 500 MG tablet Take 500 mg by mouth daily.   Yes Historical Provider, MD  OXYGEN Inhale 4 L into the lungs continuous.    Historical Provider, MD   Allergies  Allergen Reactions  . Prednisone     Caused pt to go crazy    Review of Systems  Constitutional: Positive for activity change, appetite change and fatigue.  Respiratory: Positive for shortness of breath.   Neurological: Positive for weakness.    Physical Exam  Constitutional: He appears well-developed.  HENT:  Head: Normocephalic and atraumatic.  Cardiovascular: Normal rate.   Pulmonary/Chest: Effort normal. No accessory muscle usage. No tachypnea. No respiratory distress. He has decreased breath sounds.  Abdominal: Soft. Normal appearance.  Neurological: He is alert.  Somewhat confused in conversation.   Nursing note and vitals reviewed.   Vital Signs: BP 129/82 mmHg  Pulse 89  Temp(Src) 97.5 F (36.4 C) (Oral)  Resp 22  Ht _0  (1.88 m)  Wt 89.6 kg (197 lb 8.5 oz)  BMI 25.35 kg/m2  SpO2 98% Pain Assessment: No/denies pain   Pain Score: 0-No pain   SpO2: SpO2: 98 % O2 Device:SpO2: 98 % O2 Flow Rate: .O2 Flow Rate (L/min): 3 L/min  IO: Intake/output summary:  Intake/Output Summary (Last 24 hours) at 06/08/16 1101 Last data filed at 06/08/16 1059  Gross per 24 hour  Intake    1110 ml  Output  2425 ml  Net  -1315 ml    LBM: Last BM Date: 06/05/16 Baseline Weight: Weight: 87.1 kg (192 lb 0.3 oz) Most recent weight: Weight: 89.6 kg (197 lb 8.5 oz)     Palliative Assessment/Data:   Flowsheet Rows        Most Recent Value   Intake Tab    Referral Department  Hospitalist   Unit at Time of Referral  ICU   Palliative Care Primary Diagnosis  Pulmonary   Date Notified  06/06/16   Palliative Care Type  New Palliative care   Reason for referral  Clarify Goals of Care   Date of Admission  06/05/16   Date first seen by Palliative Care  06/06/16   # of days Palliative referral response time  0 Day(s)   # of days IP prior to Palliative referral  1   Clinical Assessment    Psychosocial & Spiritual Assessment    Palliative Care Outcomes       Time In: 0900 Time Out: 1030 Time Total: 68mn Greater than 50%  of this time was spent counseling and coordinating care related to the above assessment and plan.  Signed by: PPershing Proud NP   Please contact Palliative Medicine Team phone at [phone] questions and concerns.  For individual provider: See AShea Evans

## 2016-06-08 NOTE — Evaluation (Signed)
Physical Therapy Evaluation Patient Details Name: Joshua Conrad MRN: [id number] DOB: 02-02-1946 Today's Date: 06/08/2016   History of Present Illness  70 yo male admitted with acute respiratory failure. Hx of pulm fibrosis, COPD, Kaposi sarcoma, CHF, CAD, gout, HTN, O2 dep  Clinical Impression  On eval, pt required Mod assist for mobility-performed squat pivot from bed to recliner. Dyspnea 3/4 with minimal activity. Remain on [place] O2. VCs for pursed lip breathing during session. Recommend return to SNF.     Follow Up Recommendations SNF    Equipment Recommendations  None recommended by PT    Recommendations for Other Services       Precautions / Restrictions Precautions Precautions: Fall Precaution Comments: on home Oxygen Restrictions Weight Bearing Restrictions: No      Mobility  Bed Mobility Overal bed mobility: Needs Assistance Bed Mobility: Supine to Sit     Supine to sit: Min assist;HOB elevated     General bed mobility comments: Assist to scoot to EOB. Dyspnea 3/4-so seated rest break needed before attempting stand or pivot.   Transfers Overall transfer level: Needs assistance Equipment used: Rolling walker (2 wheeled) Transfers: Squat Pivot Transfers     Squat pivot transfers: Mod assist     General transfer comment: Assist to rise, stabilize, weightshift and control descent. Pt held onto recliner armrests. dyspnea 3/4-seated rest break needed before scooting back into chair. VCs safety, pursed lip breathing.   Ambulation/Gait             General Gait Details: NT-pt unable to tolerate on today  Stairs            Wheelchair Mobility    Modified Rankin (Stroke Patients Only)       Balance Overall balance assessment: Needs assistance Sitting-balance support: Feet supported Sitting balance-Leahy Scale: Good     Standing balance support: During functional activity Standing balance-Leahy Scale: Poor                                Pertinent Vitals/Pain Pain Assessment: No/denies pain    Home Living Family/patient expects to be discharged to:: Skilled nursing facility                      Prior Function Level of Independence: Needs assistance               Hand Dominance        Extremity/Trunk Assessment   Upper Extremity Assessment: Generalized weakness           Lower Extremity Assessment: Generalized weakness      Cervical / Trunk Assessment: Kyphotic  Communication   Communication: No difficulties  Cognition Arousal/Alertness: Awake/alert   Overall Cognitive Status: No family/caregiver present to determine baseline cognitive functioning Area of Impairment: Orientation;Memory;Safety/judgement;Awareness;Problem solving Orientation Level: Disoriented to;Place;Situation;Time   Memory: Decreased short-term memory       Problem Solving: Requires tactile cues;Requires verbal cues      General Comments      Exercises        Assessment/Plan    PT Assessment    PT Diagnosis Difficulty walking;Generalized weakness;Altered mental status   PT Problem List Decreased strength;Decreased activity tolerance;Decreased balance;Decreased mobility;Decreased knowledge of use of DME;Decreased cognition;Cardiopulmonary status limiting activity  PT Treatment Interventions DME instruction;Gait training;Functional mobility training;Therapeutic activities;Patient/family education;Balance training;Therapeutic exercise   PT Goals (Current goals can be found in the Care Plan section) Acute Rehab PT Goals Patient  Stated Goal: none stated PT Goal Formulation: Patient unable to participate in goal setting Time For Goal Achievement: 06/22/16 Potential to Achieve Goals: Fair    Frequency Min 2X/week   Barriers to discharge        Co-evaluation               End of Session Equipment Utilized During Treatment: Gait belt Activity Tolerance: Patient limited by fatigue (Limited by  dyspnea) Patient left: in chair;with call bell/phone within reach;with chair alarm set           Time: [id number] PT Time Calculation (min) (ACUTE ONLY): 16 min   Charges:   PT Evaluation $PT Eval Low Complexity: 1 Procedure     PT G Codes:        Weston Anna, MPT Pager: [phone]

## 2016-06-08 NOTE — Progress Notes (Signed)
CSW spoke with patient re: discharge planning. Patient informed CSW that he was admitted from [facility] to return there when discharged, anticipating tomorrow 6/30. CSW confirmed with Narda Rutherford at [facility] that they would be able to take patient back.   CSW will check back tomorrow & facilitate discharge when ready.    Raynaldo Opitz, [facility] Clinical Social Worker cell #: [phone]

## 2016-06-08 NOTE — Progress Notes (Signed)
CRITICAL VALUE ALERT  Critical value received:  Vancomycin trough 51 Date of notification:  06/08/16 Time of notification:  [id number]  Critical value read back: yes Nurse who received alert:  Laurance Flatten  MD notified (1st page):  Dr Candiss Norse  Time of first page:  1433  MD notified (2nd page):  Time of second page:  Responding MD: Dr Candiss Norse Time MD responded:  [place] notified

## 2016-06-08 NOTE — Progress Notes (Signed)
RT placed patient on BIPAP HS. 20/8 and 4L O2 bleed in. Patient tolerating well.

## 2016-06-08 NOTE — Progress Notes (Signed)
PROGRESS NOTE                                                                                                                                                                                                             Patient Demographics:    Joshua Conrad, is a 70 y.o. male, DOB - [birth year], UA:[id number]  Admit date - 06/05/2016   Admitting Physician Ivor Costa, MD  Outpatient Primary MD for the patient is Sandi Mariscal, MD  LOS - 2  Chief Complaint  Patient presents with  . Shortness of Breath       Brief Narrative    Joshua Conrad is a 70 y.o. male with medical history significant of chronic respiratory failure on 5L oxygen and BiPAP at night at home, pulmonary fibrosis, COPD, Kaposi's sarcoma, OSA, left diaphragm paralysis, dCHF, CAD, s/p of Stent placement, gout, hyperlipidemia, hypertension, who presents with SOB and cough.  Patient was recently hospitalized from 6/4-6/7 due to acute on chronic respiratory failure, which was likely due to acute diastolic CHF, with underlying pulmonary fibrosis and left diaphragmatic paralysis. Pt was discharged to SNF at a stable condition. Pt is using Bipap at night and 5 L of O2 at home.   Pt states that his SOB has been worsening since yesterday. He Has productive cough, but no fever, chills. He has mild generalized chest pain when he coughs. He cannot speak in full sentence. He denies nausea, vomiting, diarrhea, abdominal pain. No unilateral weakness. Denies symptoms of UTI. Patient is DNR  ED Course: pt was found to have WBC 3.5, tachycardia, tachypnea, lactate 0.94, oxygen desaturated to 77% with 5 L of nasal canula O2, temperature normal. CXR showed very low lung volume, lung base opacities have worsened. Pt is admitted to stepdown bed for further evaluation and treatment.    Subjective:    Joshua Conrad today Is somnolent, does not follow commands or answer questions reliably upon my  exam.   Assessment  & Plan :     1.Pneumonia in a patient with advanced pulmonary fibrosis and COPD on 5-6 L nasal cannula oxygen at baseline and nighttime BiPAP.  Do not think he was septic, afebrile, lactate normal, pro-calcitonin normal, chest x-ray shows worsening bilateral infiltrates, BNP stable, on soft diet and speech following, follow cultures, continue empiric antibiotics which are Vancomycin and Zosyn for  now.  2. Obstructive sleep apnea, Advanced pulmonary fibrosis and COPD. With acute on chronic hypoxic and hypercapnic respiratory failure due to combination of pneumonia above and possibly mild COPD exacerbation with evidence of hypercapnia and acute on chronic respiratory acidosis.   Plan as above, BiPAP as needed in day and scheduled at nighttime, it is noncompliant with nighttime BiPAP and has been counseled, nebulizer treatments and oxygen to continue, keep pulse ox around 90, IV Solu-Medrol but taper as he is better. Follow with his pulmonologist at [place]. Today he appears to be close to his baseline.   3. Chronic diastolic CHF with EF [id number]. Appears compensated. BNP stable. Resumed home dose diuretic.  4. Chronic iron deficiency anemia. On oral and supplementation continue.  5. Gout. On allopurinol.  6. History of Kaposi's sarcoma. Treated with radiation/chemotherapy per patient at [facility] last 1 month ago. Outpatient follow-up.  7. Generalized deconditioning along with severe malnutrition. PT and nutritional supplements. Long-term prognosis guarded. Have called palliative care. Patient is refusing PT every day, [facility] on 06/09/2016.  8. Hypoxic and hypercapnic encephalopathy. Treatment as in #  1 and 2 above. Resolved now at baseline.  9. Dyslipidemia. On statin continue.  10. CAD. Status post and placement. No chest pain, no acute issues. Continue statin for secondary prevention.  11. Pancytopenia - chronic, will monitor diff, last chemo at [facility] > 51mth ago.  Counts better. PCP to check CBC with manual differential in one week after discharge.    Code Status :  DNR  Family Communication  :  None present  Disposition Plan  :  The evaluation pending for the last 3 days, likely SNF tomorrow  Consults  :  Pall Care  Diet : Soft  Procedures  :   DVT Prophylaxis  :  Lovenox   Lab Results  Component Value Date   PLT 160 06/08/2016    Inpatient Medications  Scheduled Meds: . allopurinol  300 mg Oral Daily  . bumetanide  1.5 mg Oral Daily  . cholecalciferol  400 Units Oral Daily  . dextromethorphan-guaiFENesin  1 tablet Oral BID  . enoxaparin (LOVENOX) injection  40 mg Subcutaneous Q24H  . famotidine  20 mg Oral QHS  . feeding supplement (ENSURE ENLIVE)  237 mL Oral BID BM  . ferrous sulfate  325 mg Oral BID  . fluticasone  2 spray Each Nare QHS  . ipratropium  0.5 mg Nebulization Q6H  . levalbuterol  1.25 mg Nebulization Q6H  . LORazepam  0.25 mg Intravenous Once  . losartan  50 mg Oral Daily  . methylPREDNISolone (SOLU-MEDROL) injection  60 mg Intravenous TID  . montelukast  10 mg Oral QHS  . mycophenolate  1,500 mg Oral BID  . piperacillin-tazobactam (ZOSYN)  IV  3.375 g Intravenous Q8H  . potassium chloride  20 mEq Oral Daily  . simvastatin  5 mg Oral QHS  . vancomycin  1,000 mg Intravenous Q8H  . vitamin C  500 mg Oral Daily   Continuous Infusions:  PRN Meds:.  Antibiotics  :    Anti-infectives    Start     Dose/Rate Route Frequency Ordered Stop   06/06/16 1400  vancomycin (VANCOCIN) IVPB 1000 mg/200 mL premix     1,000 mg 200 mL/hr over 60 Minutes Intravenous Every 8 hours 06/06/16 0346     06/06/16 0600  piperacillin-tazobactam (ZOSYN) IVPB 3.375 g  Status:  Discontinued     3.375 g 100 mL/hr over 30 Minutes Intravenous Every  8 hours 06/06/16 0232 06/06/16 0235   06/06/16 0600  piperacillin-tazobactam (ZOSYN) IVPB 3.375 g     3.375 g 12.5 mL/hr over 240 Minutes Intravenous Every 8 hours 06/06/16 0235      06/06/16 0030  vancomycin (VANCOCIN) 1,500 mg in sodium chloride 0.9 % 500 mL IVPB     1,500 mg 250 mL/hr over 120 Minutes Intravenous  Once 06/06/16 0020 06/06/16 0345   06/06/16 0030  piperacillin-tazobactam (ZOSYN) IVPB 3.375 g     3.375 g 100 mL/hr over 30 Minutes Intravenous  Once 06/06/16 0020 06/06/16 0131         Objective:   Filed Vitals:   06/08/16 0017 06/08/16 0543 06/08/16 0800 06/08/16 0837  BP:  133/52  129/82  Pulse: 82 85  89  Temp:  97.8 F (36.6 C)  97.5 F (36.4 C)  TempSrc:  Axillary  Oral  Resp: 18 18  22   Height:      Weight:      SpO2: 96% 95% 90% 98%    Wt Readings from Last 3 Encounters:  06/07/16 89.6 kg (197 lb 8.5 oz)  05/18/16 86.637 kg (191 lb)  11/03/15 91.8 kg (202 lb 6.1 oz)     Intake/Output Summary (Last 24 hours) at 06/08/16 1051 Last data filed at 06/08/16 0900  Gross per 24 hour  Intake   1110 ml  Output   2025 ml  Net   -915 ml     Physical Exam  Somnolent, Moves all 4 extremities to painful stimuli and by himself [place].AT,PERRAL Supple Neck,No JVD, No cervical lymphadenopathy appriciated.  Symmetrical Chest wall movement, Good air movement bilaterally, minimal wheezing and rales RRR,No Gallops,Rubs or new Murmurs, No Parasternal Heave +ve B.Sounds, Abd Soft, No tenderness, No organomegaly appriciated, No rebound - guarding or rigidity. No Cyanosis, Clubbing or edema, No new Rash or bruise       Data Review:    CBC  Recent Labs Lab 06/06/16 0044 06/06/16 0055 06/07/16 0308 06/07/16 0757 06/08/16 0513  WBC 3.5*  --  1.0* 1.0* 1.6*  HGB 10.1* 10.9* 8.7* 8.2* 9.2*  HCT 32.3* 32.0* 27.9* 26.3* 30.0*  PLT 144*  --  139* 137* 160  MCV 92.0  --  92.1 91.3 94.0  MCH 28.8  --  28.7 28.5 28.8  MCHC 31.3  --  31.2 31.2 30.7  RDW 15.2  --  15.0 14.9 15.0  LYMPHSABS 0.4*  --   --  0.2* 0.2*  MONOABS 0.3  --   --  0.0* 0.1  EOSABS 0.0  --   --  0.0 0.0  BASOSABS 0.0  --   --  0.0 0.0    Chemistries   Recent  Labs Lab 06/06/16 0055 06/07/16 0308  NA 136 137  K 4.0 4.6  CL 87* 93*  CO2  --  41*  GLUCOSE 116* 180*  BUN 32* 30*  CREATININE 1.00 0.99  CALCIUM  --  9.1   ------------------------------------------------------------------------------------------------------------------ No results for input(s): CHOL, HDL, LDLCALC, TRIG, CHOLHDL, LDLDIRECT in the last 72 hours.  No results found for: HGBA1C ------------------------------------------------------------------------------------------------------------------ No results for input(s): TSH, T4TOTAL, T3FREE, THYROIDAB in the last 72 hours.  Invalid input(s): FREET3 ------------------------------------------------------------------------------------------------------------------ No results for input(s): VITAMINB12, FOLATE, FERRITIN, TIBC, IRON, RETICCTPCT in the last 72 hours.  Coagulation profile  Recent Labs Lab 06/06/16 0328  INR 0.93    No results for input(s): DDIMER in the last 72 hours.  Cardiac Enzymes No results for input(s): CKMB, TROPONINI,  MYOGLOBIN in the last 168 hours.  Invalid input(s): CK ------------------------------------------------------------------------------------------------------------------    Component Value Date/Time   BNP 40.0 06/06/2016 0044    Micro Results Recent Results (from the past 240 hour(s))  Culture, blood (Routine X 2) w Reflex to ID Panel     Status: None (Preliminary result)   Collection Time: 06/06/16 12:43 AM  Result Value Ref Range Status   Specimen Description BLOOD LEFT ANTECUBITAL  Final   Special Requests BOTTLES DRAWN AEROBIC AND ANAEROBIC 5CC  Final   Culture   Final    NO GROWTH 2 DAYS Performed at [facility]    Report Status PENDING  Incomplete  Culture, blood (Routine X 2) w Reflex to ID Panel     Status: None (Preliminary result)   Collection Time: 06/06/16 12:49 AM  Result Value Ref Range Status   Specimen Description BLOOD LEFT ARM  Final    Special Requests IN PEDIATRIC BOTTLE 3CC  Final   Culture   Final    NO GROWTH 2 DAYS Performed at [facility]    Report Status PENDING  Incomplete  Respiratory Panel by PCR     Status: None   Collection Time: 06/06/16  4:44 AM  Result Value Ref Range Status   Adenovirus NOT DETECTED NOT DETECTED Final   Coronavirus 229E NOT DETECTED NOT DETECTED Final   Coronavirus HKU1 NOT DETECTED NOT DETECTED Final   Coronavirus NL63 NOT DETECTED NOT DETECTED Final   Coronavirus OC43 NOT DETECTED NOT DETECTED Final   Metapneumovirus NOT DETECTED NOT DETECTED Final   Rhinovirus / Enterovirus NOT DETECTED NOT DETECTED Final   Influenza A NOT DETECTED NOT DETECTED Final   Influenza A H1 NOT DETECTED NOT DETECTED Final   Influenza A H1 2009 NOT DETECTED NOT DETECTED Final   Influenza A H3 NOT DETECTED NOT DETECTED Final   Influenza B NOT DETECTED NOT DETECTED Final   Parainfluenza Virus 1 NOT DETECTED NOT DETECTED Final   Parainfluenza Virus 2 NOT DETECTED NOT DETECTED Final   Parainfluenza Virus 3 NOT DETECTED NOT DETECTED Final   Parainfluenza Virus 4 NOT DETECTED NOT DETECTED Final   Respiratory Syncytial Virus NOT DETECTED NOT DETECTED Final   Bordetella pertussis NOT DETECTED NOT DETECTED Final   Chlamydophila pneumoniae NOT DETECTED NOT DETECTED Final   Mycoplasma pneumoniae NOT DETECTED NOT DETECTED Final  MRSA PCR Screening     Status: None   Collection Time: 06/06/16  5:31 AM  Result Value Ref Range Status   MRSA by PCR NEGATIVE NEGATIVE Final    Comment:        The GeneXpert MRSA Assay (FDA approved for NASAL specimens only), is one component of a comprehensive MRSA colonization surveillance program. It is not intended to diagnose MRSA infection nor to guide or monitor treatment for MRSA infections.     Radiology Reports Dg Chest 2 View  06/06/2016  CLINICAL DATA:  Dyspnea. EXAM: CHEST  2 VIEW COMPARISON:  05/15/2016 FINDINGS: Persistent low lung volumes, with  unchanged left hemidiaphragm elevation. Lung base opacities are present, left greater than right, worsened. This could be infectious. No large effusions. IMPRESSION: Very low lung volumes. Lung base opacities have worsened and could be infectious. Electronically Signed   By: Andreas Newport M.D.   On: 06/06/2016 00:16   Dg Chest Port 1 View  06/06/2016  CLINICAL DATA:  Shortness of breath. EXAM: PORTABLE CHEST 1 VIEW COMPARISON:  June 05, 2016, May 15, 2016, and September 30, 2015 chest  radiographs FINDINGS: Shallow degree of inspiration again noted. Mild elevation of the left hemidiaphragm is stable. There is fairly diffuse interstitial prominence throughout the lungs with patchy airspace opacity in the left base, stable. No new opacity is evident. Heart appears mildly enlarged with pulmonary vascularity overall within normal limits. No adenopathy evident. Mildly prominent bowel loops in the visualized upper abdomen remain. IMPRESSION: No appreciable change from 1 day prior. Generalized interstitial prominence is noted with apparent patchy airspace disease in the left base. These changes are stable compared to prior studies and likely are due to underlying fibrosis. No new opacity is evident compared to prior studies. Stable cardiac silhouette. Stable elevation left hemidiaphragm. Electronically Signed   By: Lowella Grip III M.D.   On: 06/06/2016 08:23   Dg Chest Port 1 View  05/15/2016  CLINICAL DATA:  Respiratory failure, CHF EXAM: PORTABLE CHEST 1 VIEW COMPARISON:  05/14/2016 FINDINGS: Continued marked elevation of the left hemidiaphragm. Diffuse bilateral airspace disease again noted with slight improvement. Suspect mild cardiomegaly. No visible effusions or acute bony abnormality. IMPRESSION: Continued low lung volumes with marked elevation of the left hemidiaphragm. Slight improvement in diffuse bilateral airspace disease. Electronically Signed   By: Rolm Baptise M.D.   On: 05/15/2016 07:14   Dg  Chest Port 1 View  05/14/2016  CLINICAL DATA:  Fall during the night.  COPD. EXAM: PORTABLE CHEST 1 VIEW COMPARISON:  03/21/2016 FINDINGS: Marked elevation of the left hemidiaphragm, progressed since prior study. Diffuse airspace opacities with cardiomegaly and vascular congestion. Findings likely reflect mild edema/ CHF. No visible effusions or pneumothorax. No acute bony abnormality. IMPRESSION: Marked elevation of the left hemidiaphragm. Diffuse bilateral airspace opacities with cardiomegaly and vascular congestion. Findings likely reflect CHF. Electronically Signed   By: Rolm Baptise M.D.   On: 05/14/2016 10:57    Time Spent in minutes  30   SINGH,PRASHANT K M.D on 06/08/2016 at 10:51 AM  Between 7am to 7pm - Pager - [phone]  After 7pm go to [email or web address] - password [facility]  [facility] -  Office  [phone]     \

## 2016-06-08 NOTE — Progress Notes (Addendum)
Pharmacy Antibiotic Note  Joshua Conrad is a 70 y.o. male admitted on 06/05/2016 with pneumonia.  He is clinically improving.  MRSA PCR was negative.  Pharmacy was consulted for Vancomycin dosing.  He is currently on day # 3 of Vanc and Zosyn.    06/08/2016:   Afebrile  Neutropenia improving- ANC 1.3  Scr stable (0.99).  Est CrCl ~68ml/min  Vancomycin trough elevated (51) - goal 15-80mcg/ml  Patient currently has no IV access  Plan:  Change antibiotics to Levaquin 750mg  po daily  No further dose adjustments anticipated-pharmacy to sign off.   Height: 6\' 2"  (188 cm) Weight: 197 lb 8.5 oz (89.6 kg) IBW/kg (Calculated) : 82.2  Temp (24hrs), Avg:98 F (36.7 C), Min:97.5 F (36.4 C), Max:98.4 F (36.9 C)   Recent Labs Lab 06/06/16 0044 06/06/16 0054 06/06/16 0055 06/06/16 0328 06/06/16 0609 06/07/16 0308 06/07/16 0757 06/08/16 0513 06/08/16 1325  WBC 3.5*  --   --   --   --  1.0* 1.0* 1.6*  --   CREATININE  --   --  1.00  --   --  0.99  --   --   --   LATICACIDVEN  --  0.94  --  1.2 0.6  --   --   --   --   VANCOTROUGH  --   --   --   --   --   --   --   --  51*    Estimated Creatinine Clearance: 81.9 mL/min (by C-G formula based on Cr of 0.99).    Allergies  Allergen Reactions  . Prednisone     Caused pt to go crazy     Antimicrobials this admission: Vancomycin 06/06/2016 >>6/29 Zosyn 06/06/2016 >> 6/29 Levaquin 6/29>>  Dose adjustments this admission: 6/29 @ 1330 VT= 51 on 1gm IV q8h (before 8th dose)  Microbiology results: 6/27 BCx: sent 6/27 resp virus panel PCR: neg  6/27 MRSA PCR: neg 6/27 strep pneumo/legionella ur ag: neg/IP  Thank you for allowing pharmacy to be a part of this patient's care.  Netta Cedars, PharmD, BCPS Pager: [phone] 06/08/2016 2:49 PM

## 2016-06-08 NOTE — NC FL2 (Signed)
[place] LEVEL OF CARE SCREENING TOOL     IDENTIFICATION  Patient Name: Joshua Conrad Birthdate: 03/12/1946 Sex: male Admission Date (Current Location): 06/05/2016  [facility] and [place] Number:  Herbalist and Address:  [facility],  [place] [address] [place]      Provider Number: [id number]  Attending Physician Name and Address:  Thurnell Lose, MD  Relative Name and Phone Number:       Current Level of Care:   Recommended Level of Care: [place] Prior Approval Number:    Date Approved/Denied:   PASRR Number: [id number]  Discharge Plan: SNF    Current Diagnoses: Patient Active Problem List   Diagnosis Date Noted  . Pancytopenia ([place]) 06/07/2016  . HCAP (healthcare-associated pneumonia) 06/06/2016  . Acute on chronic respiratory failure with hypoxia ([place]) 06/06/2016  . Gout 06/06/2016  . Protein-calorie malnutrition, severe ([place]) 06/06/2016  . Diaphragm paralysis   . Pulmonary fibrosis ([place])   . Hyperlipidemia   . Generalized weakness   . Acute on chronic respiratory failure with hypoxia and hypercapnia (H[facility])   . Acute on chronic respiratory failure ([place]) 05/14/2016  . Confusion 05/14/2016  . Acute delirium 11/02/2015  . COPD (chronic obstructive pulmonary disease) ([place]) 11/02/2015  . CAD (coronary artery disease) 11/02/2015  . OSA (obstructive sleep apnea) 11/02/2015  . Chronic diastolic CHF (congestive heart failure) ([place]) 11/02/2015  . Chronic anemia 11/02/2015  . Delirium 11/01/2015    Orientation RESPIRATION BLADDER Height & Weight     Self, Situation, Place, Time  O2 (3L) Incontinent Weight: 197 lb 8.5 oz (89.6 kg) Height:  6\' 2"  (188 cm)  BEHAVIORAL SYMPTOMS/MOOD NEUROLOGICAL BOWEL NUTRITION STATUS      Continent Diet (Soft)  AMBULATORY STATUS COMMUNICATION OF NEEDS Skin   Extensive Assist Verbally Normal                       Personal Care Assistance Level of Assistance   Bathing, Feeding, Dressing Bathing Assistance: Limited assistance Feeding assistance: Limited assistance Dressing Assistance: Limited assistance     Functional Limitations Info  Sight, Hearing, Speech Sight Info: Adequate Hearing Info: Adequate Speech Info: Adequate    SPECIAL CARE FACTORS FREQUENCY  PT (By licensed PT), OT (By licensed OT)     PT Frequency: 5 OT Frequency: 5            Contractures Contractures Info: Not present    Additional Factors Info  Code Status, Allergies Code Status Info: DNR Allergies Info: Allergies:  Prednisone           Current Medications (06/08/2016):  This is the current hospital active medication list Current Facility-Administered Medications  Medication Dose Route Frequency Provider Last Rate Last Dose  . allopurinol (ZYLOPRIM) tablet 300 mg  300 mg Oral Daily Ivor Costa, MD   300 mg at 06/08/16 1020  . bumetanide (BUMEX) tablet 1.5 mg  1.5 mg Oral Daily Thurnell Lose, MD   1.5 mg at 06/08/16 1021  . cholecalciferol (VITAMIN D) tablet 400 Units  400 Units Oral Daily Ivor Costa, MD   400 Units at 06/08/16 1021  . dextromethorphan-guaiFENesin (MUCINEX DM) 30-600 MG per 12 hr tablet 1 tablet  1 tablet Oral BID Ivor Costa, MD   1 tablet at 06/08/16 1022  . enoxaparin (LOVENOX) injection 40 mg  40 mg Subcutaneous Q24H Ivor Costa, MD   40 mg at 06/08/16 1027  . famotidine (PEPCID) tablet 20 mg  20 mg Oral QHS Ivor Costa, MD   20 mg at 06/07/16 2233  . feeding supplement (ENSURE ENLIVE) (ENSURE ENLIVE) liquid 237 mL  237 mL Oral BID BM Ivor Costa, MD   237 mL at 06/08/16 1023  . ferrous sulfate tablet 325 mg  325 mg Oral BID Ivor Costa, MD   325 mg at 06/08/16 1024  . fluticasone (FLONASE) 50 MCG/ACT nasal spray 2 spray  2 spray Each Nare QHS Ivor Costa, MD   2 spray at 06/07/16 2232  . ipratropium (ATROVENT) nebulizer solution 0.5 mg  0.5 mg Nebulization Q6H Thurnell Lose, MD   0.5 mg at 06/08/16 0759  . levalbuterol (XOPENEX) nebulizer  solution 1.25 mg  1.25 mg Nebulization Q6H Thurnell Lose, MD   1.25 mg at 06/08/16 0759  . LORazepam (ATIVAN) injection 0.25 mg  0.25 mg Intravenous Once Ivor Costa, MD   0.25 mg at 06/06/16 0245  . losartan (COZAAR) tablet 50 mg  50 mg Oral Daily Ivor Costa, MD   50 mg at 06/08/16 1024  . methylPREDNISolone sodium succinate (SOLU-MEDROL) 125 mg/2 mL injection 60 mg  60 mg Intravenous Q12H Thurnell Lose, MD      . montelukast (SINGULAIR) tablet 10 mg  10 mg Oral QHS Ivor Costa, MD   10 mg at 06/07/16 2233  . mycophenolate (CELLCEPT) capsule 1,500 mg  1,500 mg Oral BID Ivor Costa, MD   1,500 mg at 06/08/16 1026  . piperacillin-tazobactam (ZOSYN) IVPB 3.375 g  3.375 g Intravenous Q8H Adeleke Oni, MD   3.375 g at 06/08/16 0600  . potassium chloride (K-DUR) CR tablet 20 mEq  20 mEq Oral Daily Thurnell Lose, MD   20 mEq at 06/08/16 1025  . simvastatin (ZOCOR) tablet 5 mg  5 mg Oral QHS Ivor Costa, MD   5 mg at 06/07/16 2233  . vancomycin (VANCOCIN) IVPB 1000 mg/200 mL premix  1,000 mg Intravenous Q8H Thurnell Lose, MD   1,000 mg at 06/08/16 0600  . vitamin C (ASCORBIC ACID) tablet 500 mg  500 mg Oral Daily Ivor Costa, MD   500 mg at 06/08/16 1025     Discharge Medications: Please see discharge summary for a list of discharge medications.  Relevant Imaging Results:  Relevant Lab Results:   Additional Information SSN: [id number]  Standley Brooking, LCSW

## 2016-06-09 DIAGNOSIS — R0602 Shortness of breath: Secondary | ICD-10-CM | POA: Insufficient documentation

## 2016-06-09 DIAGNOSIS — Z515 Encounter for palliative care: Secondary | ICD-10-CM | POA: Insufficient documentation

## 2016-06-09 MED ORDER — PREDNISONE 5 MG PO TABS: ORAL_TABLET | ORAL | Status: AC

## 2016-06-09 MED ORDER — PREDNISONE 50 MG PO TABS
50.0000 mg | ORAL_TABLET | Freq: Every day | ORAL | Status: DC
  Administered 2016-06-09: 50 mg via ORAL
  Filled 2016-06-09: qty 1

## 2016-06-09 MED ORDER — ALPRAZOLAM 0.25 MG PO TABS: 0.2500 mg | ORAL_TABLET | Freq: Every day | ORAL | Status: DC | PRN

## 2016-06-09 MED ORDER — MORPHINE SULFATE (CONCENTRATE) 10 MG/0.5ML PO SOLN: 2.5000 mg | Freq: Three times a day (TID) | ORAL | Status: DC | PRN

## 2016-06-09 MED ORDER — LEVOFLOXACIN 750 MG PO TABS: 750.0000 mg | ORAL_TABLET | Freq: Every day | ORAL | Status: AC

## 2016-06-09 NOTE — Care Management Note (Signed)
Case Management Note  Patient Details  Name: Joshua Conrad MRN: [id number] Date of Birth: [birth year]  Subjective/Objective:                    Action/Plan:d/c SNF.   Expected Discharge Date:                  Expected Discharge Plan:  Skilled Nursing Facility  In-House Referral:  Clinical Social Work  Discharge planning Services  CM Consult  Post Acute Care Choice:    Choice offered to:     DME Arranged:    DME Agency:     HH Arranged:    [place] Agency:     Status of Service:  Completed, signed off  If discussed at [employer], dates discussed:    Additional Comments:  Dessa Phi, RN 06/09/2016, 11:26 AM

## 2016-06-09 NOTE — Discharge Instructions (Signed)
Follow with Primary MD Sandi Mariscal, MD in 7 days   Get CBC, CMP, 2 view Chest X ray checked  by Primary MD next visit ( we routinely change or add medications that can affect your baseline labs and fluid status, therefore we recommend that you get the mentioned basic workup next visit with your PCP, your PCP may decide not to get them or add new tests based on their clinical decision)   Activity: As tolerated with Full fall precautions use walker/cane & assistance as needed   Disposition SNF   Diet:  Soft diet  with feeding assistance and aspiration precautions.  - Check your Weight same time everyday, if you gain over 2 pounds, or you develop in leg swelling, experience more shortness of breath or chest pain, call your Primary MD immediately. Follow Cardiac Low Salt Diet and 1.5 lit/day fluid restriction.   On your next visit with your primary care physician please Get Medicines reviewed and adjusted.   Please request your Prim.MD to go over all Hospital Tests and Procedure/Radiological results at the follow up, please get all Hospital records sent to your Prim MD by signing hospital release before you go home.   If you experience worsening of your admission symptoms, develop shortness of breath, life threatening emergency, suicidal or homicidal thoughts you must seek medical attention immediately by calling 911 or calling your MD immediately  if symptoms less severe.  You Must read complete instructions/literature along with all the possible adverse reactions/side effects for all the Medicines you take and that have been prescribed to you. Take any new Medicines after you have completely understood and accpet all the possible adverse reactions/side effects.   Do not drive, operate heavy machinery, perform activities at heights, swimming or participation in water activities or provide baby sitting services if your were admitted for syncope or siezures until you have seen by Primary MD or a  Neurologist and advised to do so again.  Do not drive when taking Pain medications.    Do not take more than prescribed Pain, Sleep and Anxiety Medications  Special Instructions: If you have smoked or chewed Tobacco  in the last 2 yrs please stop smoking, stop any regular Alcohol  and or any Recreational drug use.  Wear Seat belts while driving.   Please note  You were cared for by a hospitalist during your hospital stay. If you have any questions about your discharge medications or the care you received while you were in the hospital after you are discharged, you can call the unit and asked to speak with the hospitalist on call if the hospitalist that took care of you is not available. Once you are discharged, your primary care physician will handle any further medical issues. Please note that NO REFILLS for any discharge medications will be authorized once you are discharged, as it is imperative that you return to your primary care physician (or establish a relationship with a primary care physician if you do not have one) for your aftercare needs so that they can reassess your need for medications and monitor your lab values.

## 2016-06-09 NOTE — Discharge Summary (Signed)
Joshua Conrad [id number] DOB: 1946/09/26 DOA: 06/05/2016  PCP: Sandi Mariscal, MD  Admit date: 06/05/2016  Discharge date: 06/09/2016  Admitted From: Home   Disposition:  SNF   Recommendations for Outpatient Follow-up:   Follow up with PCP in 1-2 weeks  PCP Please obtain BMP/CBC with differential in one week, 2 view CXR (see Discharge instructions)   PCP Please follow up on the following pending results: Repeat CBC, BMP and a view chest x-ray in a week   Home Health: None   Equipment/Devices: None  Discharge Condition: Fair    CODE STATUS: DO NOT RESUSCITATE   Diet Recommendation: Soft diet with aspiration precautions and feeding assistance Consultations: Palliative care   Chief Complaint  Patient presents with  . Shortness of Breath     Brief history of present illness from the day of admission and additional interim summary     Joshua Conrad is a 70 y.o. male with medical history significant of chronic respiratory failure on 5L oxygen and BiPAP at night at home, pulmonary fibrosis, COPD, Kaposi's sarcoma, OSA, left diaphragm paralysis, dCHF, CAD, s/p of Stent placement, gout, hyperlipidemia, hypertension, who presents with SOB and cough.  Patient was recently hospitalized from 6/4-6/7 due to acute on chronic respiratory failure, which was likely due to acute diastolic CHF, with underlying pulmonary fibrosis and left diaphragmatic paralysis. Pt was discharged to SNF at a stable condition. Pt is using Bipap at night and 5 L of O2 at home.   Pt states that his SOB has been worsening since yesterday. He Has productive cough, but no fever, chills. He has mild generalized chest pain when he coughs. He cannot speak in full sentence. He denies nausea, vomiting, diarrhea, abdominal pain. No unilateral weakness. Denies  symptoms of UTI. Patient is DNR  ED Course: pt was found to have WBC 3.5, tachycardia, tachypnea, lactate 0.94, oxygen desaturated to 77% with 5 L of nasal canula O2, temperature normal. CXR showed very low lung volume, lung base opacities have worsened. Pt is admitted to stepdown bed for further evaluation and treatment.    Hospital issues addressed    1.Pneumonia in a patient with advanced pulmonary fibrosis and COPD on 3 L nasal cannula oxygen at baseline and nighttime BiPAP.  Do not think he was septic, afebrile, lactate normal, pro-calcitonin normal, chest x-ray shows worsening bilateral infiltrates, BNP stable, on soft diet and speech following, usually treated with broad-spectrum IV antibiotics now tapered to Levaquin for 3 more days then stop, clinically much better, note keep oxygen lowest possible for pulse ox of 89 or above.  2. Obstructive sleep apnea, Advanced pulmonary fibrosis and COPD. With acute on chronic hypoxic and hypercapnic respiratory failure due to combination of pneumonia above and possibly mild COPD exacerbation with evidence of hypercapnia and acute on chronic respiratory acidosis. Resolved after plan as above and BiPAP at nighttime. Also given IV steroids for mild COPD exacerbation tolerated Solu-Medrol well, will be transitioned to oral prednisone taper upon discharge, post discharge he must follow with his pulmonologist  at [facility] post DC. Today he appears to be close to his baseline.   3. Chronic diastolic CHF with EF [id number]. Appears compensated. BNP stable. Resumed home dose diuretic.  4. Chronic iron deficiency anemia. On oral and supplementation continue.  5. Gout. On allopurinol.  6. History of Kaposi's sarcoma. Treated with radiation/chemotherapy per patient at [facility] last 1 month ago. Outpatient follow-up.  7. Generalized deconditioning along with severe malnutrition. He was seen by PT was given nutritional supplements, long-term prognosis is guarded, seen by  palliative care currently DO NOT RESUSCITATE. Will be discharged to SNF.  8. Hypoxic and hypercapnic encephalopathy. Treatment as in # 1 and 2 above. Resolved now at baseline. Remains at risk for mild delirium.  9. Dyslipidemia. On statin continue.  10. CAD. Status post and placement. No chest pain, no acute issues. Continue statin for secondary prevention.  11. Pancytopenia - chronic, will monitor diff, last chemo at [facility] > 72mth ago. Counts better. PCP to check CBC with manual differential in one week after discharge.     Discharge diagnosis     Principal Problem:   Acute on chronic respiratory failure with hypoxia and hypercapnia (HCC) Active Problems:   COPD (chronic obstructive pulmonary disease) (HCC)   CAD (coronary artery disease)   OSA (obstructive sleep apnea)   Chronic diastolic CHF (congestive heart failure) (HCC)   Chronic anemia   Generalized weakness   HCAP (healthcare-associated pneumonia)   Diaphragm paralysis   Pulmonary fibrosis (HCC)   Hyperlipidemia   Acute on chronic respiratory failure with hypoxia (HCC)   Gout   Protein-calorie malnutrition, severe (HCC)   Pancytopenia (HCC)   Palliative care encounter   SOB (shortness of breath)    Discharge instructions    Discharge Instructions    Discharge instructions    Complete by:  As directed   Follow with Primary MD Sandi Mariscal, MD in 7 days   Get CBC, CMP, 2 view Chest X ray checked  by Primary MD next visit ( we routinely change or add medications that can affect your baseline labs and fluid status, therefore we recommend that you get the mentioned basic workup next visit with your PCP, your PCP may decide not to get them or add new tests based on their clinical decision)   Activity: As tolerated with Full fall precautions use walker/cane & assistance as needed   Disposition SNF   Diet:  Soft diet  with feeding assistance and aspiration precautions.  - Check your Weight same time everyday, if you  gain over 2 pounds, or you develop in leg swelling, experience more shortness of breath or chest pain, call your Primary MD immediately. Follow Cardiac Low Salt Diet and 1.5 lit/day fluid restriction.   On your next visit with your primary care physician please Get Medicines reviewed and adjusted.   Please request your Prim.MD to go over all Hospital Tests and Procedure/Radiological results at the follow up, please get all Hospital records sent to your Prim MD by signing hospital release before you go home.   If you experience worsening of your admission symptoms, develop shortness of breath, life threatening emergency, suicidal or homicidal thoughts you must seek medical attention immediately by calling 911 or calling your MD immediately  if symptoms less severe.  You Must read complete instructions/literature along with all the possible adverse reactions/side effects for all the Medicines you take and that have been prescribed to you. Take any new Medicines after you have completely understood and accpet all  the possible adverse reactions/side effects.   Do not drive, operate heavy machinery, perform activities at heights, swimming or participation in water activities or provide baby sitting services if your were admitted for syncope or siezures until you have seen by Primary MD or a Neurologist and advised to do so again.  Do not drive when taking Pain medications.    Do not take more than prescribed Pain, Sleep and Anxiety Medications  Special Instructions: If you have smoked or chewed Tobacco  in the last 2 yrs please stop smoking, stop any regular Alcohol  and or any Recreational drug use.  Wear Seat belts while driving.   Please note  You were cared for by a hospitalist during your hospital stay. If you have any questions about your discharge medications or the care you received while you were in the hospital after you are discharged, you can call the unit and asked to speak with  the hospitalist on call if the hospitalist that took care of you is not available. Once you are discharged, your primary care physician will handle any further medical issues. Please note that NO REFILLS for any discharge medications will be authorized once you are discharged, as it is imperative that you return to your primary care physician (or establish a relationship with a primary care physician if you do not have one) for your aftercare needs so that they can reassess your need for medications and monitor your lab values.     Increase activity slowly    Complete by:  As directed            Discharge Medications     Medication List    STOP taking these medications        OXYGEN      TAKE these medications        albuterol (2.5 MG/3ML) 0.083% nebulizer solution  Commonly known as:  PROVENTIL  Take 2.5 mg by nebulization every 4 (four) hours as needed for wheezing or shortness of breath.     albuterol 108 (90 Base) MCG/ACT inhaler  Commonly known as:  PROVENTIL HFA;VENTOLIN HFA  Inhale 2 puffs into the lungs every 6 (six) hours as needed for wheezing or shortness of breath.     allopurinol 300 MG tablet  Commonly known as:  ZYLOPRIM  Take 300 mg by mouth daily.     bumetanide 1 MG tablet  Commonly known as:  BUMEX  Take 1.5 tablets (1.5 mg total) by mouth daily.     cholecalciferol 400 units Tabs tablet  Commonly known as:  VITAMIN D  Take 400 Units by mouth daily.     feeding supplement (ENSURE ENLIVE) Liqd  Take 237 mLs by mouth 2 (two) times daily between meals.     ferrous sulfate 325 (65 FE) MG tablet  Take 325 mg by mouth 2 (two) times daily.     fluticasone 50 MCG/ACT nasal spray  Commonly known as:  FLONASE  Place 2 sprays into both nostrils at bedtime.     levofloxacin 750 MG tablet  Commonly known as:  LEVAQUIN  Take 1 tablet (750 mg total) by mouth daily. 3 more days     losartan 50 MG tablet  Commonly known as:  COZAAR  Take 50 mg by mouth daily.      montelukast 10 MG tablet  Commonly known as:  SINGULAIR  Take 10 mg by mouth at bedtime.     mycophenolate 500 MG tablet  Commonly known as:  CELLCEPT  Take 1,500 mg by mouth 2 (two) times daily.     potassium chloride 10 MEQ tablet  Commonly known as:  K-DUR  Take 2 tablets (20 mEq total) by mouth daily.     predniSONE 5 MG tablet  Commonly known as:  DELTASONE  Label  & dispense according to the schedule below. 10 Pills PO for 3 days then, 8 Pills PO for 3 days, 6 Pills PO for 3 days, 4 Pills PO for 3 days, 2 Pills PO for 3 days, 1 Pills PO for 3 days, 1/2 Pill  PO for 3 days then STOP. Total 95 pills.     ranitidine 150 MG tablet  Commonly known as:  ZANTAC  Take 150 mg by mouth 2 (two) times daily.     simvastatin 5 MG tablet  Commonly known as:  ZOCOR  Take 5 mg by mouth at bedtime.     vitamin C 500 MG tablet  Commonly known as:  ASCORBIC ACID  Take 500 mg by mouth daily.        Allergies  Allergen Reactions  . Prednisone     Caused pt to go crazy         Follow-up Information    Follow up with Sandi Mariscal, MD. Schedule an appointment as soon as possible for a visit in 1 week.   Specialty:  Internal Medicine   Why:  and your Pulmonary and Cancer MD within 1 week   Contact information:   [place]  [id number] [phone]        Major procedures and Radiology Reports - PLEASE review detailed and final reports for all details, in brief -      Dg Chest 2 View  06/06/2016  CLINICAL DATA:  Dyspnea. EXAM: CHEST  2 VIEW COMPARISON:  05/15/2016 FINDINGS: Persistent low lung volumes, with unchanged left hemidiaphragm elevation. Lung base opacities are present, left greater than right, worsened. This could be infectious. No large effusions. IMPRESSION: Very low lung volumes. Lung base opacities have worsened and could be infectious. Electronically Signed   By: Andreas Newport M.D.   On: 06/06/2016 00:16   Dg Chest Port 1 View  06/06/2016   CLINICAL DATA:  Shortness of breath. EXAM: PORTABLE CHEST 1 VIEW COMPARISON:  June 05, 2016, May 15, 2016, and September 30, 2015 chest radiographs FINDINGS: Shallow degree of inspiration again noted. Mild elevation of the left hemidiaphragm is stable. There is fairly diffuse interstitial prominence throughout the lungs with patchy airspace opacity in the left base, stable. No new opacity is evident. Heart appears mildly enlarged with pulmonary vascularity overall within normal limits. No adenopathy evident. Mildly prominent bowel loops in the visualized upper abdomen remain. IMPRESSION: No appreciable change from 1 day prior. Generalized interstitial prominence is noted with apparent patchy airspace disease in the left base. These changes are stable compared to prior studies and likely are due to underlying fibrosis. No new opacity is evident compared to prior studies. Stable cardiac silhouette. Stable elevation left hemidiaphragm. Electronically Signed   By: Lowella Grip III M.D.   On: 06/06/2016 08:23   Dg Chest Port 1 View  05/15/2016  CLINICAL DATA:  Respiratory failure, CHF EXAM: PORTABLE CHEST 1 VIEW COMPARISON:  05/14/2016 FINDINGS: Continued marked elevation of the left hemidiaphragm. Diffuse bilateral airspace disease again noted with slight improvement. Suspect mild cardiomegaly. No visible effusions or acute bony abnormality. IMPRESSION: Continued low lung volumes with marked elevation of the left hemidiaphragm. Slight improvement  in diffuse bilateral airspace disease. Electronically Signed   By: Rolm Baptise M.D.   On: 05/15/2016 07:14   Dg Chest Port 1 View  05/14/2016  CLINICAL DATA:  Fall during the night.  COPD. EXAM: PORTABLE CHEST 1 VIEW COMPARISON:  03/21/2016 FINDINGS: Marked elevation of the left hemidiaphragm, progressed since prior study. Diffuse airspace opacities with cardiomegaly and vascular congestion. Findings likely reflect mild edema/ CHF. No visible effusions or pneumothorax.  No acute bony abnormality. IMPRESSION: Marked elevation of the left hemidiaphragm. Diffuse bilateral airspace opacities with cardiomegaly and vascular congestion. Findings likely reflect CHF. Electronically Signed   By: Rolm Baptise M.D.   On: 05/14/2016 10:57    Micro Results      Recent Results (from the past 240 hour(s))  Culture, blood (Routine X 2) w Reflex to ID Panel     Status: None (Preliminary result)   Collection Time: 06/06/16 12:43 AM  Result Value Ref Range Status   Specimen Description BLOOD LEFT ANTECUBITAL  Final   Special Requests BOTTLES DRAWN AEROBIC AND ANAEROBIC 5CC  Final   Culture   Final    NO GROWTH 2 DAYS Performed at [facility]    Report Status PENDING  Incomplete  Culture, blood (Routine X 2) w Reflex to ID Panel     Status: None (Preliminary result)   Collection Time: 06/06/16 12:49 AM  Result Value Ref Range Status   Specimen Description BLOOD LEFT ARM  Final   Special Requests IN PEDIATRIC BOTTLE 3CC  Final   Culture   Final    NO GROWTH 2 DAYS Performed at [facility]    Report Status PENDING  Incomplete  Respiratory Panel by PCR     Status: None   Collection Time: 06/06/16  4:44 AM  Result Value Ref Range Status   Adenovirus NOT DETECTED NOT DETECTED Final   Coronavirus 229E NOT DETECTED NOT DETECTED Final   Coronavirus HKU1 NOT DETECTED NOT DETECTED Final   Coronavirus NL63 NOT DETECTED NOT DETECTED Final   Coronavirus OC43 NOT DETECTED NOT DETECTED Final   Metapneumovirus NOT DETECTED NOT DETECTED Final   Rhinovirus / Enterovirus NOT DETECTED NOT DETECTED Final   Influenza A NOT DETECTED NOT DETECTED Final   Influenza A H1 NOT DETECTED NOT DETECTED Final   Influenza A H1 2009 NOT DETECTED NOT DETECTED Final   Influenza A H3 NOT DETECTED NOT DETECTED Final   Influenza B NOT DETECTED NOT DETECTED Final   Parainfluenza Virus 1 NOT DETECTED NOT DETECTED Final   Parainfluenza Virus 2 NOT DETECTED NOT DETECTED Final    Parainfluenza Virus 3 NOT DETECTED NOT DETECTED Final   Parainfluenza Virus 4 NOT DETECTED NOT DETECTED Final   Respiratory Syncytial Virus NOT DETECTED NOT DETECTED Final   Bordetella pertussis NOT DETECTED NOT DETECTED Final   Chlamydophila pneumoniae NOT DETECTED NOT DETECTED Final   Mycoplasma pneumoniae NOT DETECTED NOT DETECTED Final  MRSA PCR Screening     Status: None   Collection Time: 06/06/16  5:31 AM  Result Value Ref Range Status   MRSA by PCR NEGATIVE NEGATIVE Final    Comment:        The GeneXpert MRSA Assay (FDA approved for NASAL specimens only), is one component of a comprehensive MRSA colonization surveillance program. It is not intended to diagnose MRSA infection nor to guide or monitor treatment for MRSA infections.     Today   Subjective    Joshua Conrad today has no headache,no chest  abdominal pain,no new weakness tingling or numbness, feels much better.   Objective   Blood pressure 117/58, pulse 90, temperature 97.7 F (36.5 C), temperature source Oral, resp. rate 20, height 6\' 2"  (1.88 m), weight 89.6 kg (197 lb 8.5 oz), SpO2 98 %.   Intake/Output Summary (Last 24 hours) at 06/09/16 1000 Last data filed at 06/09/16 0700  Gross per 24 hour  Intake    240 ml  Output    400 ml  Net   -160 ml    Exam Awake Alert, Oriented x 3, No new F.N deficits, Normal affect [place].AT,PERRAL Supple Neck,No JVD, No cervical lymphadenopathy appriciated.  Symmetrical Chest wall movement, Good air movement bilaterally, few rales RRR,No Gallops,Rubs or new Murmurs, No Parasternal Heave +ve B.Sounds, Abd Soft, Non tender, No organomegaly appriciated, No rebound -guarding or rigidity. No Cyanosis, Clubbing or edema, No new Rash or bruise   Data Review   CBC w Diff: Lab Results  Component Value Date   WBC 1.6* 06/08/2016   HGB 9.2* 06/08/2016   HCT 30.0* 06/08/2016   PLT 160 06/08/2016   LYMPHOPCT 14 06/08/2016   MONOPCT 4 06/08/2016   EOSPCT 0 06/08/2016    BASOPCT 3 06/08/2016    CMP: Lab Results  Component Value Date   NA 137 06/07/2016   K 4.6 06/07/2016   CL 93* 06/07/2016   CO2 41* 06/07/2016   BUN 30* 06/07/2016   CREATININE 0.99 06/07/2016   PROT 6.8 05/14/2016   ALBUMIN 4.1 05/14/2016   BILITOT 0.9 05/14/2016   ALKPHOS 68 05/14/2016   AST 21 05/14/2016   ALT 11* 05/14/2016  .   Total Time in preparing paper work, data evaluation and todays exam - 35 minutes  Thurnell Lose M.D on 06/09/2016 at 10:00 AM  [facility]   Office  [phone]

## 2016-06-09 NOTE — Care Management Important Message (Signed)
Important Message  Patient Details  Name: Joshua Conrad MRN: [id number] Date of Birth: November 21, 1946   Medicare Important Message Given:  Yes    Camillo Flaming 06/09/2016, 10:58 AMImportant Message  Patient Details  Name: Joshua Conrad MRN: [id number] Date of Birth: 09-06-1946   Medicare Important Message Given:  Yes    Camillo Flaming 06/09/2016, 10:58 AM

## 2016-06-09 NOTE — Progress Notes (Signed)
[facility] called for transport.     Jeovanni Heuring, LCSW [facility] Clinical Social Worker cell #: [phone]  

## 2016-06-09 NOTE — Progress Notes (Signed)
Patient is set to discharge back to [facility] today. Patient aware & CSW left voicemail for sister-in-law, Bethena Roys (ph#: [phone] & [phone]) . Discharge packet given to RN, Katharine Look. [facility] will be called for transport when ready.   Raynaldo Opitz, [facility] Clinical Social Worker cell #: [phone]

## 2016-06-11 LAB — CULTURE, BLOOD (ROUTINE X 2)
CULTURE: NO GROWTH
Culture: NO GROWTH

## 2016-07-11 DEATH — deceased

## 2017-01-17 IMAGING — DX DG CHEST 2V
2 series · 2 of 2 positions shown · non-contrast
Comparison: Prior chest x-ray 09/30/2015

CLINICAL DATA: 69-year-old male with confusion and shortness of
breath

EXAM:
CHEST  2 VIEW

[chest pa]
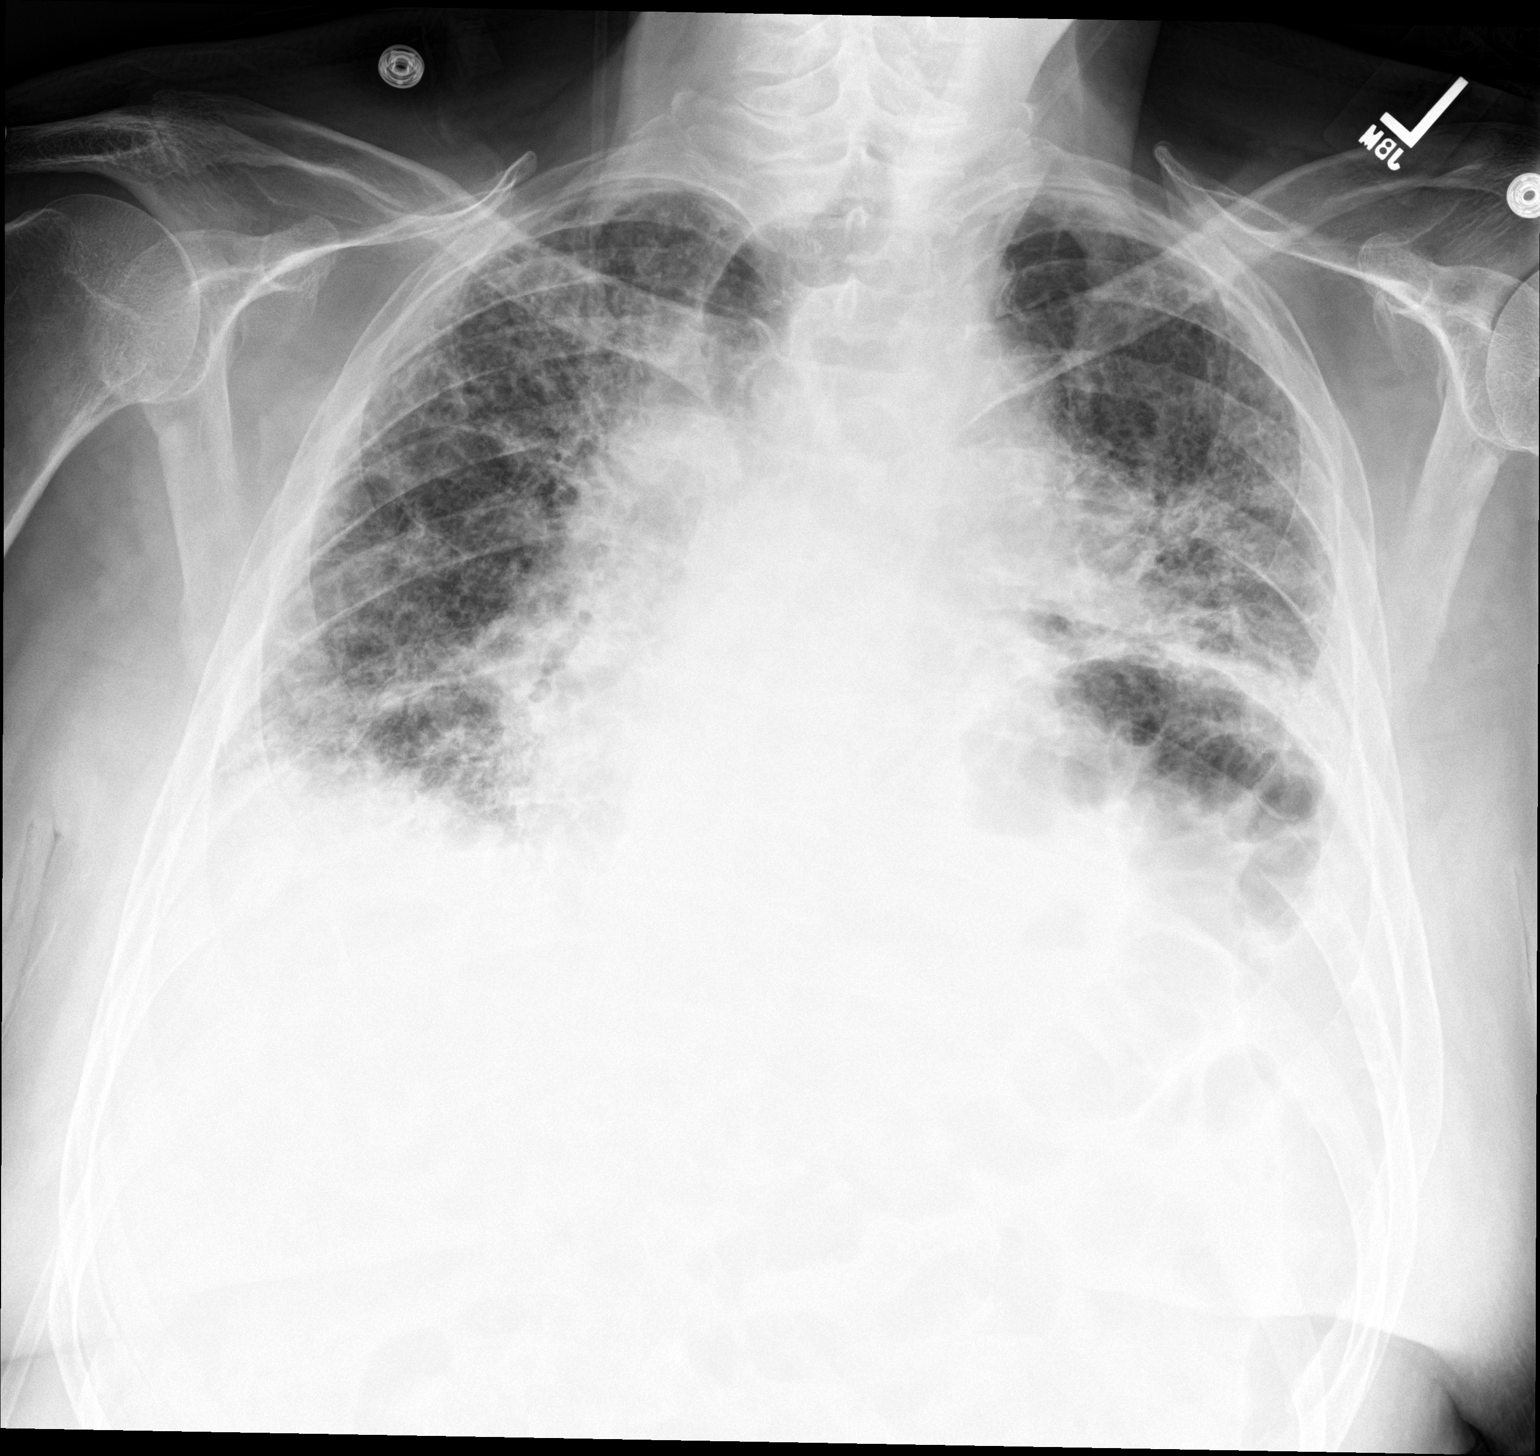

[chest lat]
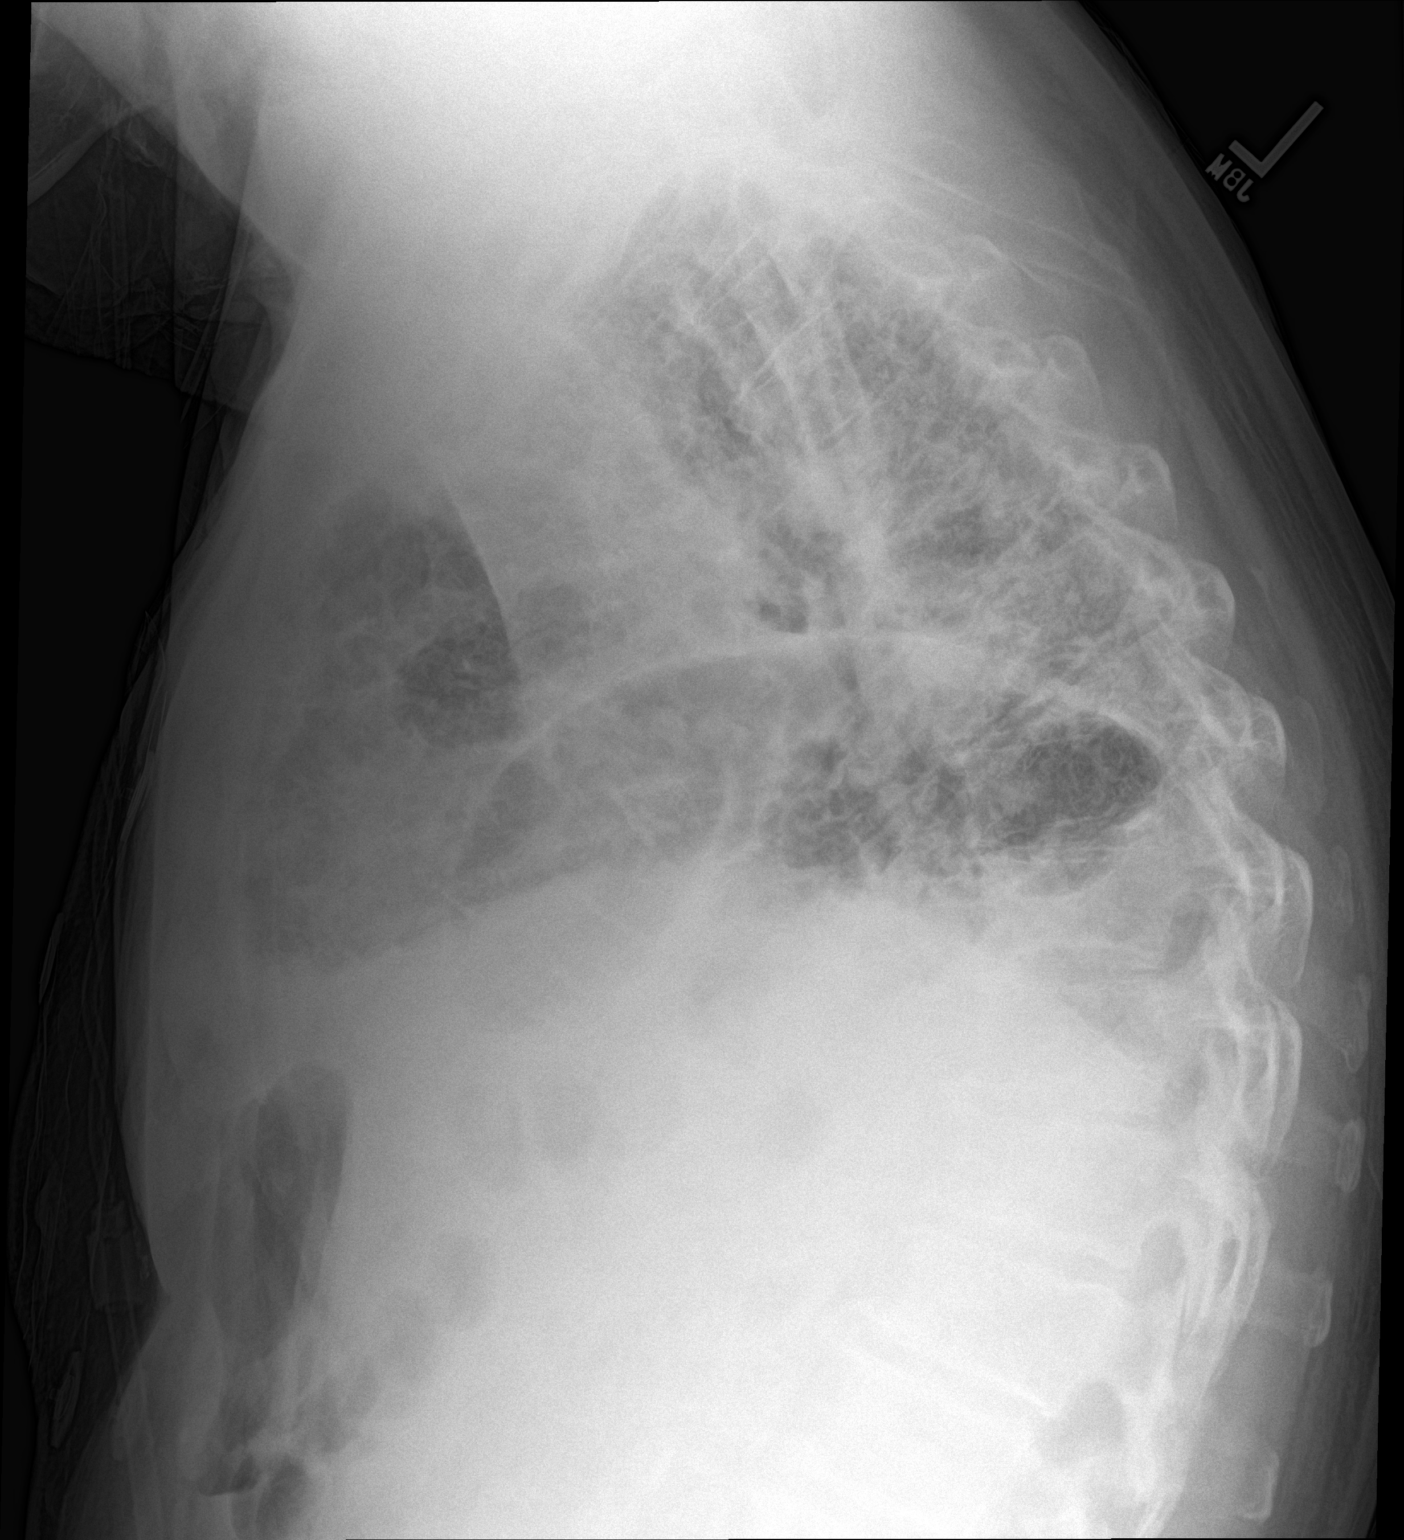

[2 of 2 positions shown; findings below may reference images not displayed]

FINDINGS: Very low inspiratory volumes with extensive predominantly basilar
and peripheral interstitial opacities in a reticular pattern.
Overall, there is very little change in the appearance of the lungs
compared to prior imaging. Cardiac and the mediastinal contours are
grossly unchanged and partially distorted due to chronic elevation
of the left hemidiaphragm. No pneumothorax. The visualized bowel gas
pattern is unremarkable. No acute osseous abnormality.
IMPRESSION: Stable appearance of advanced pulmonary parenchymal changes most
suggestive of advanced pulmonary fibrosis.

Persistent chronic elevation of the left hemidiaphragm.

## 2017-06-07 IMAGING — CR DG CHEST 2V
2 series · 2 of 2 positions shown · non-contrast
Comparison: 11/01/2015

CLINICAL DATA: Shortness of Breath

EXAM:
CHEST  2 VIEW

[w chest lat]
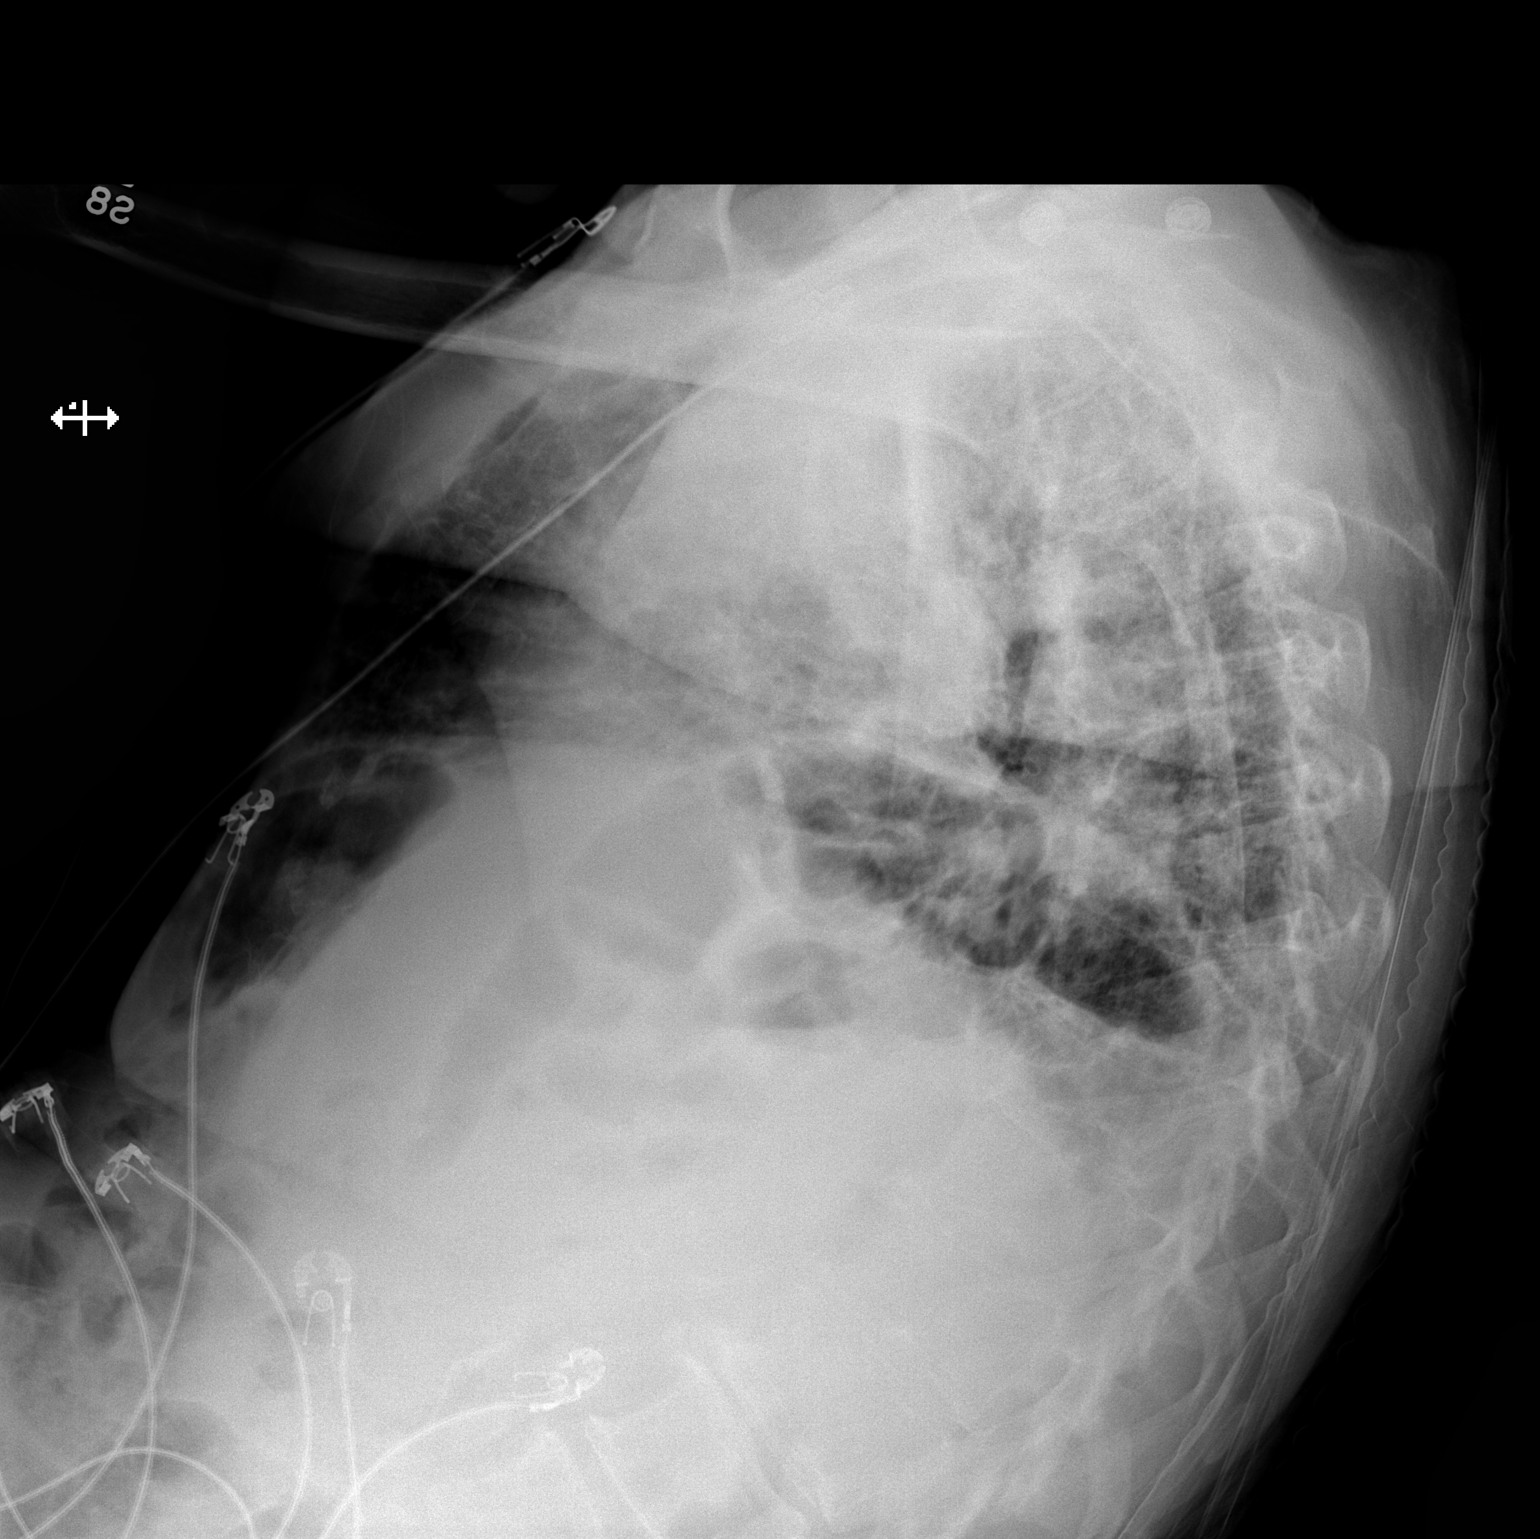

[x chest ap]
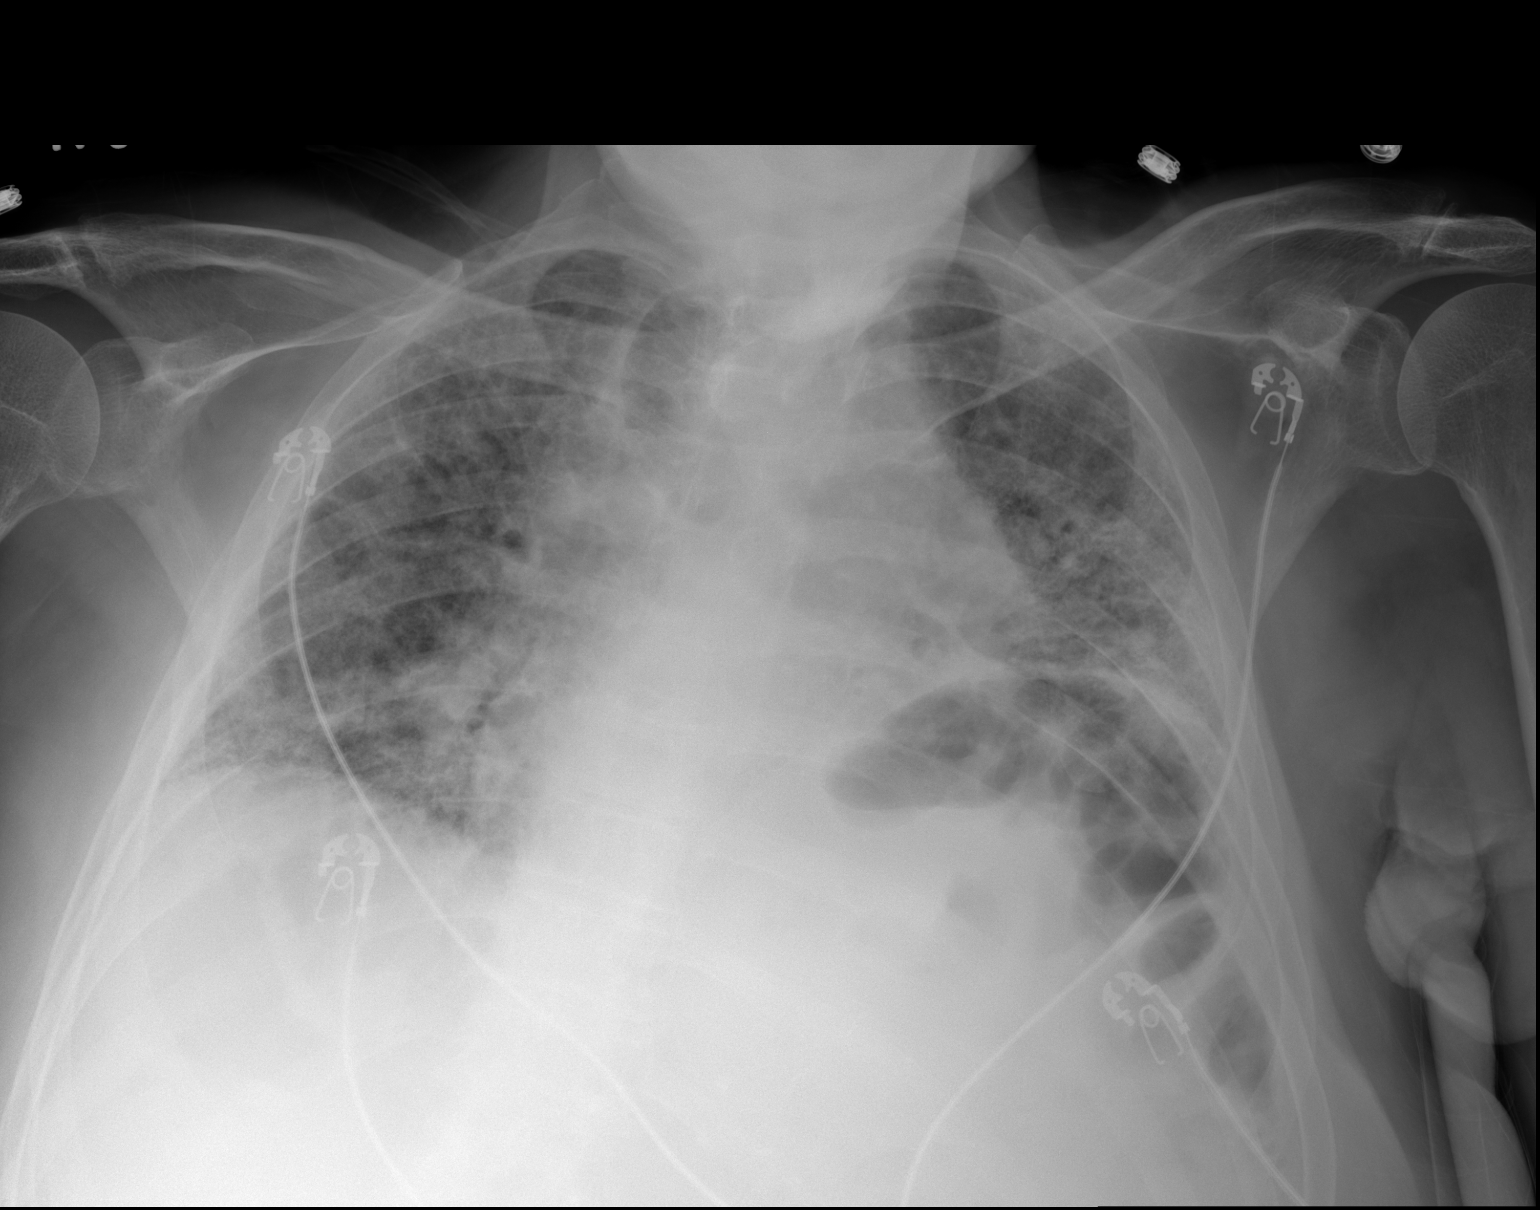

[2 of 2 positions shown; findings below may reference images not displayed]

FINDINGS: Cardiac shadow is stable but mildly enlarged. Diffuse fibrotic
changes are again identified throughout both lungs. No definitive
focal infiltrate is seen. Elevation of left hemidiaphragm is again
noted. No bony abnormality is seen.
IMPRESSION: Chronic fibrotic changes bilaterally without definitive acute
component.

## 2017-08-01 IMAGING — DX DG CHEST 1V PORT
1 series · 1 of 1 positions shown · non-contrast
Comparison: 05/14/2016

CLINICAL DATA: Respiratory failure, CHF

EXAM:
PORTABLE CHEST 1 VIEW

[chest ap]
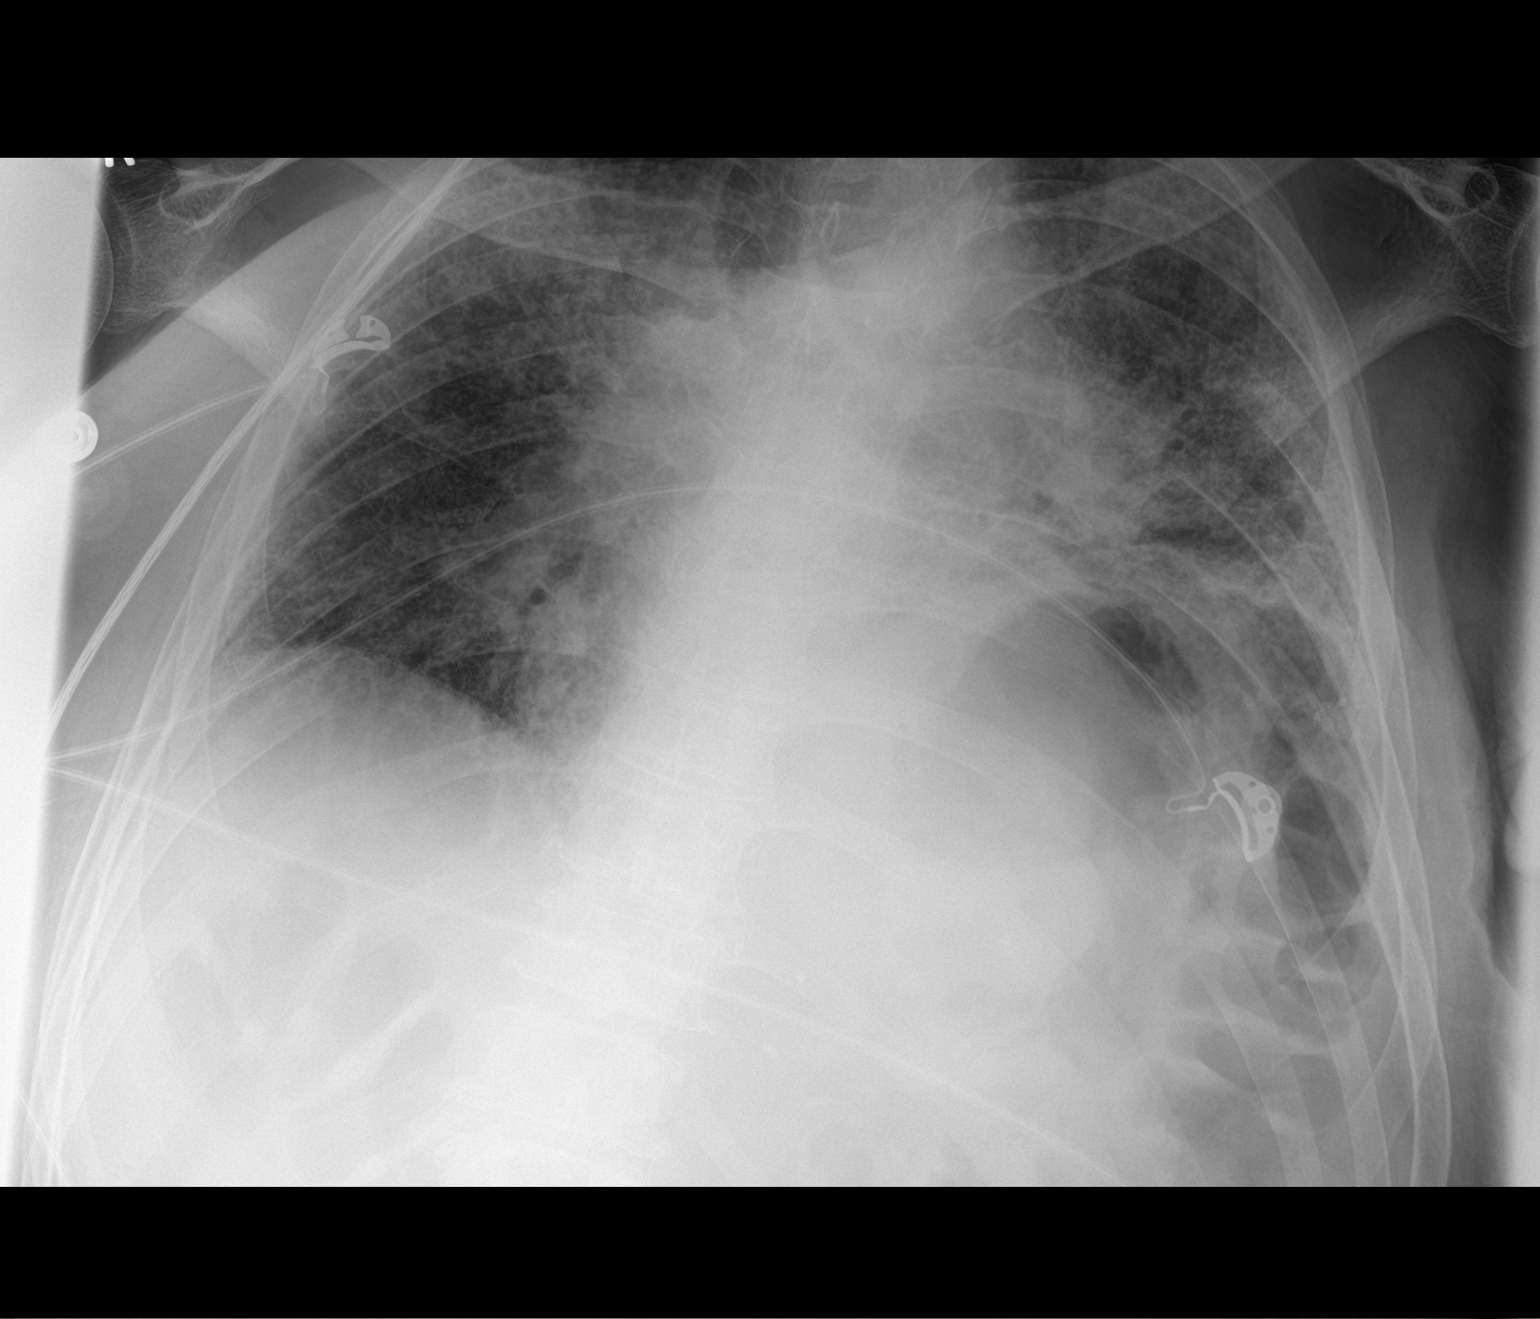

[1 of 1 positions shown; findings below may reference images not displayed]

FINDINGS: Continued marked elevation of the left hemidiaphragm. Diffuse
bilateral airspace disease again noted with slight improvement.
Suspect mild cardiomegaly. No visible effusions or acute bony
abnormality.
IMPRESSION: Continued low lung volumes with marked elevation of the left
hemidiaphragm. Slight improvement in diffuse bilateral airspace
disease.
